# Patient Record
Sex: Female | Born: 1945 | Race: White | Hispanic: No | Marital: Married | Smoking: Never smoker
Health system: Southern US, Community
[De-identification: ages and names within clinical notes are randomized; demographics above are authoritative.]

## PROBLEM LIST (undated history)

## (undated) DIAGNOSIS — E785 Hyperlipidemia, unspecified: Secondary | ICD-10-CM

## (undated) DIAGNOSIS — I480 Paroxysmal atrial fibrillation: Secondary | ICD-10-CM

## (undated) DIAGNOSIS — I10 Essential (primary) hypertension: Secondary | ICD-10-CM

## (undated) DIAGNOSIS — R7982 Elevated C-reactive protein (CRP): Secondary | ICD-10-CM

## (undated) HISTORY — DX: Elevated C-reactive protein (CRP): R79.82

## (undated) HISTORY — DX: Hyperlipidemia, unspecified: E78.5

## (undated) HISTORY — DX: Essential (primary) hypertension: I10

## (undated) HISTORY — DX: Paroxysmal atrial fibrillation: I48.0

---

## 1998-08-16 ENCOUNTER — Other Ambulatory Visit: Admission: RE | Admit: 1998-08-16 | Discharge: 1998-08-16 | Payer: Self-pay | Admitting: Obstetrics and Gynecology

## 1998-09-02 ENCOUNTER — Other Ambulatory Visit: Admission: RE | Admit: 1998-09-02 | Discharge: 1998-09-02 | Payer: Self-pay | Admitting: Obstetrics and Gynecology

## 1999-08-08 ENCOUNTER — Other Ambulatory Visit: Admission: RE | Admit: 1999-08-08 | Discharge: 1999-08-08 | Payer: Self-pay | Admitting: Obstetrics and Gynecology

## 2000-09-10 ENCOUNTER — Other Ambulatory Visit: Admission: RE | Admit: 2000-09-10 | Discharge: 2000-09-10 | Payer: Self-pay | Admitting: Obstetrics and Gynecology

## 2001-09-16 ENCOUNTER — Other Ambulatory Visit: Admission: RE | Admit: 2001-09-16 | Discharge: 2001-09-16 | Payer: Self-pay | Admitting: Obstetrics and Gynecology

## 2002-10-19 ENCOUNTER — Other Ambulatory Visit: Admission: RE | Admit: 2002-10-19 | Discharge: 2002-10-19 | Payer: Self-pay | Admitting: Obstetrics and Gynecology

## 2003-11-03 ENCOUNTER — Other Ambulatory Visit: Admission: RE | Admit: 2003-11-03 | Discharge: 2003-11-03 | Payer: Self-pay | Admitting: Obstetrics and Gynecology

## 2010-08-17 ENCOUNTER — Ambulatory Visit: Payer: Self-pay | Admitting: Cardiology

## 2011-01-11 ENCOUNTER — Ambulatory Visit: Payer: Self-pay | Admitting: Cardiology

## 2011-06-08 ENCOUNTER — Telehealth: Payer: Self-pay | Admitting: Cardiology

## 2011-06-08 NOTE — Telephone Encounter (Signed)
RN scheduled a recall for pt for 01/13 with Dr. [place].  Pt notified.

## 2011-06-08 NOTE — Telephone Encounter (Signed)
Patient called returning your phone call from a few days ago. When offered to be placed in your voice mail she ok, pleasantly gave me her phone number, thanked me and, despite me repeating that I would like to place her in your voicemail, hung up. Please call back

## 2011-07-19 ENCOUNTER — Other Ambulatory Visit: Payer: Self-pay | Admitting: *Deleted

## 2011-07-19 ENCOUNTER — Other Ambulatory Visit: Payer: Self-pay | Admitting: Cardiology

## 2011-07-19 MED ORDER — ATORVASTATIN CALCIUM 10 MG PO TABS: 10.0000 mg | ORAL_TABLET | Freq: Every day | ORAL | Status: DC

## 2012-01-03 ENCOUNTER — Encounter: Payer: Self-pay | Admitting: *Deleted

## 2012-01-03 DIAGNOSIS — I480 Paroxysmal atrial fibrillation: Secondary | ICD-10-CM | POA: Insufficient documentation

## 2012-01-04 ENCOUNTER — Other Ambulatory Visit: Payer: Self-pay

## 2012-01-04 ENCOUNTER — Other Ambulatory Visit (INDEPENDENT_AMBULATORY_CARE_PROVIDER_SITE_OTHER): Payer: Medicare Other | Admitting: *Deleted

## 2012-01-04 DIAGNOSIS — I4891 Unspecified atrial fibrillation: Secondary | ICD-10-CM

## 2012-01-04 DIAGNOSIS — E785 Hyperlipidemia, unspecified: Secondary | ICD-10-CM

## 2012-01-04 LAB — BASIC METABOLIC PANEL
BUN: 14 mg/dL (ref 6–23)
CO2: 27 mEq/L (ref 19–32)
Calcium: 9.1 mg/dL (ref 8.4–10.5)
GFR: 105.83 mL/min (ref >60.00)
Glucose, Bld: 95 mg/dL (ref 70–99)

## 2012-01-04 LAB — HEPATIC FUNCTION PANEL
ALT: 20 U/L (ref 0–35)
AST: 22 U/L (ref 0–37)
Alkaline Phosphatase: 76 U/L (ref 39–117)
Bilirubin, Direct: 0.1 mg/dL (ref 0.0–0.3)
Total Bilirubin: 0.5 mg/dL (ref 0.3–1.2)

## 2012-01-04 LAB — LIPID PANEL
LDL Cholesterol: 76 mg/dL (ref 0–99)
Total CHOL/HDL Ratio: 2

## 2012-01-08 ENCOUNTER — Encounter: Payer: Self-pay | Admitting: Cardiology

## 2012-01-08 ENCOUNTER — Ambulatory Visit (INDEPENDENT_AMBULATORY_CARE_PROVIDER_SITE_OTHER): Payer: Medicare Other | Admitting: Cardiology

## 2012-01-08 VITALS — BP 132/76 | HR 64 | Ht 66.0 in | Wt 146.8 lb

## 2012-01-08 DIAGNOSIS — I10 Essential (primary) hypertension: Secondary | ICD-10-CM | POA: Insufficient documentation

## 2012-01-08 DIAGNOSIS — I480 Paroxysmal atrial fibrillation: Secondary | ICD-10-CM

## 2012-01-08 DIAGNOSIS — E785 Hyperlipidemia, unspecified: Secondary | ICD-10-CM

## 2012-01-08 DIAGNOSIS — I4891 Unspecified atrial fibrillation: Secondary | ICD-10-CM

## 2012-01-08 NOTE — Assessment & Plan Note (Signed)
Symptoms are well controlled on Toprol. I recommend continuing on her same dose. She is to avoid cardiac stimulants. I will followup again in one year.

## 2012-01-08 NOTE — Patient Instructions (Signed)
Continue your current medication except reduce ASA to 81 mg daily.  I will see you again in 1 year with fasting lab.

## 2012-01-08 NOTE — Assessment & Plan Note (Signed)
Blood pressure is under excellent control. We'll continue with her current therapy.

## 2012-01-08 NOTE — Progress Notes (Signed)
Linda Snow Date of Birth: 1945-12-31 Medical Record #[id number]  History of Present Illness: Linda Snow is seen for yearly followup. She is a former patient of Dr. Deborah Chalk. She has a history of paroxysmal atrial fibrillation, hypertension, and hypercholesterolemia. She also has a family history of early coronary disease. She has had an elevated CRP level in the past but her level  one year ago was normal at 0.3. She states she has had a few episodes of irregular heartbeat. This only lasts a few minutes and usually occurs at night. She's had no dizziness or syncope. She denies any chest pain or shortness of breath. She did have an echocardiogram in June of 2011 which showed mild mitral insufficiency. It was otherwise normal. A nuclear stress test in July of 2011 was normal. Ejection fraction was 70%.  Current Outpatient Prescriptions on File Prior to Visit  Medication Sig Dispense Refill  . aspirin 325 MG tablet Take 325 mg by mouth daily.        [name] atorvastatin (LIPITOR) 10 MG tablet Take 1 tablet (10 mg total) by mouth daily.  30 tablet  12  . benazepril (LOTENSIN) 5 MG tablet Take 5 mg by mouth daily.        . Cholecalciferol (VITAMIN D) 2000 UNITS CAPS Take by mouth daily.        . fish oil-omega-3 fatty acids 1000 MG capsule Take 1 g by mouth daily. Taking 1200 daily       . metoprolol (TOPROL-XL) 50 MG 24 hr tablet Take 50 mg by mouth 2 (two) times daily.        . Multiple Vitamin (MULTIVITAMIN) capsule Take 1 capsule by mouth daily.        . vitamin C (ASCORBIC ACID) 500 MG tablet Take 500 mg by mouth daily.          No Known Allergies  Past Medical History  Diagnosis Date  . Hyperlipidemia   . Hypertension   . PAF (paroxysmal atrial fibrillation)     hx of,echo 06/09/2010 normal LV function and normal atrial size  . Elevated C-reactive protein (CRP)     No past surgical history on file.  History  Smoking status  . Never Smoker   Smokeless tobacco  . Not on file     History  Alcohol Use No    No family history on file.  Review of Systems:  All other systems were reviewed and are negative.  Physical Exam: BP 132/76  Pulse 64  Ht 5\' 6"  (1.676 m)  Wt 146 lb 12.8 oz (66.588 kg)  BMI 23.69 kg/m2 The patient is alert and oriented x 3.  The mood and affect are normal.  The skin is warm and dry.  Color is normal.  The HEENT exam reveals that the sclera are nonicteric.  The mucous membranes are moist.  The carotids are 2+ without bruits.  There is no thyromegaly.  There is no JVD.  The lungs are clear.  The chest wall is non tender.  The heart exam reveals a regular rate with a normal S1 and S2.  There are no murmurs, gallops, or rubs.  The PMI is not displaced.   Abdominal exam reveals good bowel sounds.  There is no guarding or rebound.  There is no hepatosplenomegaly or tenderness.  There are no masses.  Exam of the legs reveal no clubbing, cyanosis, or edema.  The legs are without rashes.  The distal pulses are intact.  Cranial  nerves II - XII are intact.  Motor and sensory functions are intact.  The gait is normal.  LABORATORY DATA: Lab Results  Component Value Date   CHOL 150 01/04/2012   HDL 63.50 01/04/2012   LDLCALC 76 01/04/2012   TRIG 54.0 01/04/2012   CHOLHDL 2 01/04/2012   ECG today demonstrates normal sinus rhythm with a rate of 59 beats per minute. There is nonspecific ST abnormality.  Assessment / Plan:

## 2012-01-08 NOTE — Assessment & Plan Note (Signed)
Recent lipid panel was in excellent control on Lipitor. Her last CRP level year ago was normal. We will continue on her current risk factor modification. I will followup again in one year and check fasting lab work at that time.

## 2012-01-12 ENCOUNTER — Other Ambulatory Visit: Payer: Self-pay | Admitting: Cardiology

## 2012-08-13 ENCOUNTER — Other Ambulatory Visit: Payer: Self-pay | Admitting: *Deleted

## 2012-08-13 MED ORDER — ATORVASTATIN CALCIUM 10 MG PO TABS: 10.0000 mg | ORAL_TABLET | Freq: Every day | ORAL | Status: DC

## 2012-08-13 NOTE — Telephone Encounter (Signed)
Fax Received. Refill Completed. Linda Snow (R.M.A)   

## 2012-09-10 ENCOUNTER — Other Ambulatory Visit: Payer: Self-pay | Admitting: Cardiology

## 2012-10-09 ENCOUNTER — Other Ambulatory Visit: Payer: Self-pay | Admitting: Cardiology

## 2013-01-07 ENCOUNTER — Other Ambulatory Visit (INDEPENDENT_AMBULATORY_CARE_PROVIDER_SITE_OTHER): Payer: Medicare Other

## 2013-01-07 DIAGNOSIS — E785 Hyperlipidemia, unspecified: Secondary | ICD-10-CM

## 2013-01-07 DIAGNOSIS — I480 Paroxysmal atrial fibrillation: Secondary | ICD-10-CM

## 2013-01-07 DIAGNOSIS — I4891 Unspecified atrial fibrillation: Secondary | ICD-10-CM

## 2013-01-07 DIAGNOSIS — I10 Essential (primary) hypertension: Secondary | ICD-10-CM

## 2013-01-07 LAB — HEPATIC FUNCTION PANEL
ALT: 23 U/L (ref 0–35)
AST: 22 U/L (ref 0–37)
Albumin: 3.8 g/dL (ref 3.5–5.2)
Alkaline Phosphatase: 62 U/L (ref 39–117)
Total Protein: 7.3 g/dL (ref 6.0–8.3)

## 2013-01-07 LAB — BASIC METABOLIC PANEL
BUN: 13 mg/dL (ref 6–23)
CO2: 28 mEq/L (ref 19–32)
Glucose, Bld: 95 mg/dL (ref 70–99)
Potassium: 4.1 mEq/L (ref 3.5–5.1)
Sodium: 138 mEq/L (ref 135–145)

## 2013-01-13 ENCOUNTER — Encounter: Payer: Self-pay | Admitting: Cardiology

## 2013-01-13 ENCOUNTER — Ambulatory Visit (INDEPENDENT_AMBULATORY_CARE_PROVIDER_SITE_OTHER): Payer: Medicare Other | Admitting: Cardiology

## 2013-01-13 VITALS — BP 160/82 | HR 53 | Ht 66.5 in | Wt 145.2 lb

## 2013-01-13 DIAGNOSIS — I4891 Unspecified atrial fibrillation: Secondary | ICD-10-CM

## 2013-01-13 DIAGNOSIS — E785 Hyperlipidemia, unspecified: Secondary | ICD-10-CM

## 2013-01-13 DIAGNOSIS — I10 Essential (primary) hypertension: Secondary | ICD-10-CM

## 2013-01-13 DIAGNOSIS — I480 Paroxysmal atrial fibrillation: Secondary | ICD-10-CM

## 2013-01-13 NOTE — Progress Notes (Signed)
Linda Snow Date of Birth: 11-Nov-1945 Medical Record #[id number]  History of Present Illness: Linda Snow is seen for yearly followup. She has a history of paroxysmal atrial fibrillation, hypertension, and hypercholesterolemia. She also has a family history of early coronary disease. She has had an elevated CRP level in the past. On follow up today she is feeling well. No chest pain or SOB. She is concerned about hair loss and attributes this to her metoprolol. Her BP at home has ranged from 104-132 systolic and 70-84 diastolic. No palpitations.  Current Outpatient Prescriptions on File Prior to Visit  Medication Sig Dispense Refill  . aspirin 325 MG tablet Take 325 mg by mouth daily.        [name] atorvastatin (LIPITOR) 10 MG tablet Take 1 tablet (10 mg total) by mouth daily.  30 tablet  5  . benazepril (LOTENSIN) 5 MG tablet TAKE 1 TABLET EVERY DAY  90 tablet  4  . Cholecalciferol (VITAMIN D) 2000 UNITS CAPS Take by mouth daily.        . fish oil-omega-3 fatty acids 1000 MG capsule Take 1 g by mouth daily. Taking 1200 daily       . metoprolol succinate (TOPROL-XL) 50 MG 24 hr tablet TAKE 1 TABLET BY MOUTH TWICE A DAY  60 tablet  7  . Multiple Vitamin (MULTIVITAMIN) capsule Take 1 capsule by mouth daily.        . vitamin C (ASCORBIC ACID) 500 MG tablet Take 500 mg by mouth daily.          No Known Allergies  Past Medical History  Diagnosis Date  . Hyperlipidemia   . Hypertension   . PAF (paroxysmal atrial fibrillation)     hx of,echo 06/09/2010 normal LV function and normal atrial size  . Elevated C-reactive protein (CRP)     History reviewed. No pertinent past surgical history.  History  Smoking status  . Never Smoker   Smokeless tobacco  . Not on file    History  Alcohol Use No    History reviewed. No pertinent family history.  Review of Systems: As noted in HPI. All other systems were reviewed and are negative.  Physical Exam: BP 160/82  Pulse 53  Ht 5' 6.5"  (1.689 m)  Wt 145 lb 3.2 oz (65.862 kg)  BMI 23.08 kg/m2 The patient is alert and oriented x 3.  The mood and affect are normal.  The skin is warm and dry.   The HEENT exam is normal. The carotids are 2+ without bruits.  There is no thyromegaly.  There is no JVD.  The lungs are clear. The heart exam reveals a regular rate with a normal S1 and S2.  There are no murmurs, gallops, or rubs.  The PMI is not displaced.   Abdominal exam reveals good bowel sounds.   There is no hepatosplenomegaly or tenderness.  There are no masses.  Exam of the legs reveal no clubbing, cyanosis, or edema. The distal pulses are intact.  Cranial nerves II - XII are intact.  Motor and sensory functions are intact.  The gait is normal.  LABORATORY DATA: Lab Results  Component Value Date   CHOL 156 01/07/2013   HDL 62.80 01/07/2013   LDLCALC 83 01/07/2013   TRIG 50.0 01/07/2013   CHOLHDL 2 01/07/2013   ECG today demonstrates sinus bradycardia with a rate of 53 beats per minute. There is nonspecific ST abnormality.  Assessment / Plan: 1. HTN- well controlled with metoprolol  and benazepril. Will taper off metoprolol given her concern about hair loss. If BP increases we can increase benazepril. Follow up in 6 months.  2. Paroxysmal atrial fibrillation. No recurrence. Avoid stimulants. If she notes increased arrhythmia off metoprolol would consider diltiazem instead.   3. Hyperlipidemia. Reviewed last lipid panel. Suggested reducing lipitor to 5 mg daily. Repeat fasting labs including chemistries, lipids, and CRP in 2-3 months.

## 2013-01-13 NOTE — Patient Instructions (Addendum)
Reduce toprol to 50 mg daily for 3 weeks. If BP remains controlled and no arrhythmia reduce to 25 mg daily then stop.  Reduce lipitor to 5 mg daily  I will see you in 6 months with fasting lab

## 2013-07-07 ENCOUNTER — Encounter: Payer: Self-pay | Admitting: Cardiology

## 2013-07-16 ENCOUNTER — Other Ambulatory Visit: Payer: Self-pay | Admitting: Cardiology

## 2013-07-27 ENCOUNTER — Encounter: Payer: Self-pay | Admitting: Cardiology

## 2013-07-27 ENCOUNTER — Ambulatory Visit (INDEPENDENT_AMBULATORY_CARE_PROVIDER_SITE_OTHER): Payer: Medicare Other | Admitting: Cardiology

## 2013-07-27 VITALS — BP 130/70 | HR 66 | Ht 66.5 in | Wt 143.0 lb

## 2013-07-27 DIAGNOSIS — I10 Essential (primary) hypertension: Secondary | ICD-10-CM

## 2013-07-27 DIAGNOSIS — I4891 Unspecified atrial fibrillation: Secondary | ICD-10-CM

## 2013-07-27 DIAGNOSIS — I480 Paroxysmal atrial fibrillation: Secondary | ICD-10-CM

## 2013-07-27 DIAGNOSIS — E785 Hyperlipidemia, unspecified: Secondary | ICD-10-CM

## 2013-07-27 MED ORDER — RIVAROXABAN 20 MG PO TABS: 20.0000 mg | ORAL_TABLET | Freq: Every day | ORAL | Status: DC

## 2013-07-27 MED ORDER — ATORVASTATIN CALCIUM 10 MG PO TABS: 5.0000 mg | ORAL_TABLET | Freq: Every day | ORAL | Status: DC

## 2013-07-27 MED ORDER — BENAZEPRIL HCL 10 MG PO TABS: 10.0000 mg | ORAL_TABLET | Freq: Every day | ORAL | Status: DC

## 2013-07-27 MED ORDER — METOPROLOL SUCCINATE ER 25 MG PO TB24: 25.0000 mg | ORAL_TABLET | Freq: Every day | ORAL | Status: DC

## 2013-07-27 NOTE — Progress Notes (Signed)
Linda Snow Date of Birth: 1945-01-29 Medical Record #[id number]  History of Present Illness: Linda Snow is seen for yearly followup. She has a history of paroxysmal atrial fibrillation, hypertension, and hypercholesterolemia. She also has a family history of early coronary disease. She has had an elevated CRP level in the past. And followup today she reports she is doing well. We previously had reduced her metoprolol dose because of hair loss. He states this did help significantly when we reduced her dose to 25 mg. Her blood pressure did increase and she increased her benazepril to 10 mg daily. Since then her blood pressure has been well-controlled. She does report episodes of palpitations. She can recall 4 episodes of irregular rhythm some lasting over 12 hours. She denies any dizziness or syncope. She has no shortness of breath or chest pain.  Current Outpatient Prescriptions on File Prior to Visit  Medication Sig Dispense Refill  . Cholecalciferol (VITAMIN D) 2000 UNITS CAPS Take by mouth daily.        . fish oil-omega-3 fatty acids 1000 MG capsule Take 1 g by mouth daily. Taking 1200 daily       . Multiple Vitamin (MULTIVITAMIN) capsule Take 1 capsule by mouth daily.        . vitamin C (ASCORBIC ACID) 500 MG tablet Take 500 mg by mouth daily.         No current facility-administered medications on file prior to visit.    No Known Allergies  Past Medical History  Diagnosis Date  . Hyperlipidemia   . Hypertension   . PAF (paroxysmal atrial fibrillation)     hx of,echo 06/09/2010 normal LV function and normal atrial size  . Elevated C-reactive protein (CRP)     History reviewed. No pertinent past surgical history.  History  Smoking status  . Never Smoker   Smokeless tobacco  . Not on file    History  Alcohol Use No    History reviewed. No pertinent family history.  Review of Systems: As noted in HPI. All other systems were reviewed and are negative.  Physical  Exam: BP 130/70  Pulse 66  Ht 5' 6.5" (1.689 m)  Wt 143 lb (64.864 kg)  BMI 22.74 kg/m2  SpO2 97% The patient is alert and oriented x 3.   The skin is warm and dry.   The HEENT exam is normal. The carotids are 2+ without bruits.  There is no thyromegaly.  There is no JVD.  The lungs are clear. The heart exam reveals a regular rate with a normal S1 and S2.  There are no murmurs, gallops, or rubs.  The PMI is not displaced.   Abdominal exam reveals good bowel sounds.   There is no hepatosplenomegaly or tenderness.  There are no masses.  Exam of the legs reveal no clubbing, cyanosis, or edema. The distal pulses are intact.  Cranial nerves II - XII are intact.  Motor and sensory functions are intact.  The gait is normal.  LABORATORY DATA: Most recent laboratory data from 07/08/2013 shows a C-reactive protein of 19, total cholesterol 163, triglycerides 66, HDL 64, LDL 81. Complete chemistry panel was normal.  Assessment / Plan: 1. HTN- well controlled with metoprolol and benazepril. Continue current therapy.  2. Paroxysmal atrial fibrillation. She has had symptomatic recurrence. Her CHAD-Vasc score is 3 based on her age, hypertension, and female sex. I recommended anticoagulation. We discussed options including Coumadin and the novel agents. We have elected to start her on  Xarelto 20 mg daily. We will stop aspirin.  3. Hyperlipidemia. Reviewed last lipid panel. Continue lipitor to 5 mg daily.

## 2013-07-27 NOTE — Patient Instructions (Signed)
Stop ASA   Start Xarelto 20 mg daily with evening meal.  Avoid NSAIDs.

## 2014-01-04 ENCOUNTER — Encounter: Payer: Self-pay | Admitting: Cardiology

## 2014-01-04 ENCOUNTER — Ambulatory Visit (INDEPENDENT_AMBULATORY_CARE_PROVIDER_SITE_OTHER): Payer: Medicare Other | Admitting: Cardiology

## 2014-01-04 ENCOUNTER — Encounter (INDEPENDENT_AMBULATORY_CARE_PROVIDER_SITE_OTHER): Payer: Self-pay

## 2014-01-04 VITALS — BP 140/80 | HR 55 | Ht 66.0 in | Wt 138.0 lb

## 2014-01-04 DIAGNOSIS — I4891 Unspecified atrial fibrillation: Secondary | ICD-10-CM

## 2014-01-04 DIAGNOSIS — E785 Hyperlipidemia, unspecified: Secondary | ICD-10-CM

## 2014-01-04 DIAGNOSIS — I480 Paroxysmal atrial fibrillation: Secondary | ICD-10-CM

## 2014-01-04 DIAGNOSIS — I10 Essential (primary) hypertension: Secondary | ICD-10-CM

## 2014-01-04 NOTE — Patient Instructions (Signed)
Stop taking Toprol  Monitor your blood pressure and let me know if it is staying high.

## 2014-01-04 NOTE — Progress Notes (Signed)
Linda Snow Date of Birth: 09-Oct-1945 Medical Record #[id number]  History of Present Illness: Linda Snow is seen for yearly followup. She has a history of paroxysmal atrial fibrillation, hypertension, and hypercholesterolemia. She also has a family history of early coronary disease. She has had an elevated CRP level in the past. And followup today she reports she is doing well. We previously had reduced her metoprolol dose because of hair loss. He states this did help significantly when we reduced her dose to 25 mg. Her blood pressure did increase and she increased her benazepril to 10 mg daily. Since then her blood pressure has been well-controlled. She denies any recurrent palpitations. She would like to come off metoprolol completely.  Current Outpatient Prescriptions on File Prior to Visit  Medication Sig Dispense Refill  . atorvastatin (LIPITOR) 10 MG tablet Take 0.5 tablets (5 mg total) by mouth daily.  30 tablet  5  . benazepril (LOTENSIN) 10 MG tablet Take 1 tablet (10 mg total) by mouth daily.  90 tablet  3  . Cholecalciferol (VITAMIN D) 2000 UNITS CAPS Take by mouth daily.        . fish oil-omega-3 fatty acids 1000 MG capsule Take 1 g by mouth daily. Taking 1200 daily       . metoprolol succinate (TOPROL-XL) 25 MG 24 hr tablet Take 1 tablet (25 mg total) by mouth daily. Take with or immediately following a meal.  60 tablet  7  . Multiple Vitamin (MULTIVITAMIN) capsule Take 1 capsule by mouth daily.        . Rivaroxaban (XARELTO) 20 MG TABS Take 1 tablet (20 mg total) by mouth daily with supper.  90 tablet  3  . vitamin C (ASCORBIC ACID) 500 MG tablet Take 500 mg by mouth daily.         No current facility-administered medications on file prior to visit.    No Known Allergies  Past Medical History  Diagnosis Date  . Hyperlipidemia   . Hypertension   . PAF (paroxysmal atrial fibrillation)     hx of,echo 06/09/2010 normal LV function and normal atrial size  . Elevated  C-reactive protein (CRP)     History reviewed. No pertinent past surgical history.  History  Smoking status  . Never Smoker   Smokeless tobacco  . Not on file    History  Alcohol Use No    History reviewed. No pertinent family history.  Review of Systems: As noted in HPI. All other systems were reviewed and are negative.  Physical Exam: BP 140/80  Pulse 55  Ht 5\' 6"  (1.676 m)  Wt 138 lb (62.596 kg)  BMI 22.28 kg/m2 The patient is alert and oriented x 3.   The skin is warm and dry.   The HEENT exam is normal. The carotids are 2+ without bruits.  There is no thyromegaly.  There is no JVD.  The lungs are clear. The heart exam reveals a regular rate with a normal S1 and S2.  There are no murmurs, gallops, or rubs.  The PMI is not displaced.   Abdominal exam reveals good bowel sounds.   There is no hepatosplenomegaly or tenderness.  There are no masses.  Exam of the legs reveal no clubbing, cyanosis, or edema. The distal pulses are intact.  Cranial nerves II - XII are intact.  Motor and sensory functions are intact.  The gait is normal.  LABORATORY DATA: Ecg today demonstrates sinus bradycardia with rate 55 bpm. Mild nonspecific  ST abnormality.  Assessment / Plan: 1. HTN- well controlled with metoprolol and benazepril. We will stop metoprolol now. If BP increases will try increasing benazepril dose. I will follow up in 6 months.  2. Paroxysmal atrial fibrillation.  Her CHAD-Vasc score is 3 based on her age, hypertension, and female sex. Continue Xarelto.  3. Hyperlipidemia. Reviewed last lipid panel. Continue lipitor to 5 mg daily.

## 2014-07-05 ENCOUNTER — Encounter: Payer: Self-pay | Admitting: Cardiology

## 2014-07-05 ENCOUNTER — Ambulatory Visit (INDEPENDENT_AMBULATORY_CARE_PROVIDER_SITE_OTHER): Payer: Medicare Other | Admitting: Cardiology

## 2014-07-05 VITALS — BP 149/95 | HR 79 | Ht 66.5 in | Wt 136.6 lb

## 2014-07-05 DIAGNOSIS — I10 Essential (primary) hypertension: Secondary | ICD-10-CM

## 2014-07-05 DIAGNOSIS — E785 Hyperlipidemia, unspecified: Secondary | ICD-10-CM

## 2014-07-05 DIAGNOSIS — I4891 Unspecified atrial fibrillation: Secondary | ICD-10-CM

## 2014-07-05 DIAGNOSIS — I480 Paroxysmal atrial fibrillation: Secondary | ICD-10-CM

## 2014-07-05 MED ORDER — BENAZEPRIL HCL 10 MG PO TABS: 10.0000 mg | ORAL_TABLET | Freq: Every day | ORAL | Status: DC

## 2014-07-05 MED ORDER — RIVAROXABAN 20 MG PO TABS: 20.0000 mg | ORAL_TABLET | Freq: Every day | ORAL | Status: DC

## 2014-07-05 NOTE — Patient Instructions (Signed)
Continue your current therapy  I will see you in 6 months.   

## 2014-07-05 NOTE — Progress Notes (Signed)
   Linda Snow Date of Birth: 07/04/1945 Medical Record #[id number]  History of Present Illness: Linda Snow is seen for  followup. She has a history of paroxysmal atrial fibrillation, hypertension, and hypercholesterolemia. She also has a family history of early coronary disease. She has had an elevated CRP level in the past. And followup today she reports she is doing well. She is off of her metoprolol completely now and her hair loss has reversed. BP at home has been well controlled with readings of 110-134/ 70-90. Denies any palpitations. Stays active.  Current Outpatient Prescriptions on File Prior to Visit  Medication Sig Dispense Refill  . atorvastatin (LIPITOR) 10 MG tablet Take 0.5 tablets (5 mg total) by mouth daily.  30 tablet  5  . Cholecalciferol (VITAMIN D) 2000 UNITS CAPS Take by mouth daily.        . fish oil-omega-3 fatty acids 1000 MG capsule Take 1 g by mouth daily. Taking 1200 daily       . Multiple Vitamin (MULTIVITAMIN) capsule Take 1 capsule by mouth daily.        . vitamin C (ASCORBIC ACID) 500 MG tablet Take 500 mg by mouth daily.         No current facility-administered medications on file prior to visit.    No Known Allergies  Past Medical History  Diagnosis Date  . Hyperlipidemia   . Hypertension   . PAF (paroxysmal atrial fibrillation)     hx of,echo 06/09/2010 normal LV function and normal atrial size  . Elevated C-reactive protein (CRP)     History reviewed. No pertinent past surgical history.  History  Smoking status  . Never Smoker   Smokeless tobacco  . Not on file    History  Alcohol Use No    History reviewed. No pertinent family history.  Review of Systems: As noted in HPI. All other systems were reviewed and are negative.  Physical Exam: BP 149/95  Pulse 79  Ht 5' 6.5" (1.689 m)  Wt 136 lb 9.6 oz (61.961 kg)  BMI 21.72 kg/m2 The patient is alert and oriented x 3.   The skin is warm and dry.   The HEENT exam is normal.  The carotids are 2+ without bruits.  There is no thyromegaly.  There is no JVD.  The lungs are clear. The heart exam reveals a regular rate with a normal S1 and S2.  There are no murmurs, gallops, or rubs.  The PMI is not displaced.   Abdominal exam reveals good bowel sounds.   There is no hepatosplenomegaly or tenderness.  There are no masses.  Exam of the legs reveal no clubbing, cyanosis, or edema. The distal pulses are intact.  Cranial nerves II - XII are intact.  Motor and sensory functions are intact.  The gait is normal.  LABORATORY DATA:   Assessment / Plan: 1. HTN- well controlled with  Benazepril only. We stay off metoprolol.   2. Paroxysmal atrial fibrillation.  Her CHAD-Vasc score is 3 based on her age, hypertension, and female sex. Continue Xarelto.  3. Hyperlipidemia. Reviewed last lipid panel. Continue lipitor to 5 mg daily.

## 2015-02-07 ENCOUNTER — Ambulatory Visit (INDEPENDENT_AMBULATORY_CARE_PROVIDER_SITE_OTHER): Payer: Medicare PPO | Admitting: Cardiology

## 2015-02-07 ENCOUNTER — Encounter: Payer: Self-pay | Admitting: Cardiology

## 2015-02-07 VITALS — BP 142/90 | HR 106 | Ht 66.5 in | Wt 135.8 lb

## 2015-02-07 DIAGNOSIS — I10 Essential (primary) hypertension: Secondary | ICD-10-CM

## 2015-02-07 DIAGNOSIS — E785 Hyperlipidemia, unspecified: Secondary | ICD-10-CM

## 2015-02-07 DIAGNOSIS — I480 Paroxysmal atrial fibrillation: Secondary | ICD-10-CM

## 2015-02-07 NOTE — Patient Instructions (Signed)
Continue your current therapy  I will see you in 6 months.   

## 2015-02-07 NOTE — Progress Notes (Signed)
Linda Snow Date of Birth: 1945/12/22 Medical Record #[id number]  History of Present Illness: Linda Snow is seen for  followup Afib. She has a history of paroxysmal atrial fibrillation, hypertension, and hypercholesterolemia. She also has a family history of early coronary disease. She has had an elevated CRP level in the past. On  followup today she reports she is doing well. Metoprolol caused hair loss in the past. . BP at home has been well controlled. She stays active. She is really unaware of her heart beat. 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   Current Outpatient Prescriptions on File Prior to Visit  Medication Sig Dispense Refill  . atorvastatin (LIPITOR) 10 MG tablet Take 0.5 tablets (5 mg total) by mouth daily. 30 tablet 5  . benazepril (LOTENSIN) 10 MG tablet Take 1 tablet (10 mg total) by mouth daily. 90 tablet 3  . Cholecalciferol (VITAMIN D) 2000 UNITS CAPS Take by mouth daily.      . fish oil-omega-3 fatty acids 1000 MG capsule Take 1 g by mouth daily. Taking 1200 daily     . Multiple Vitamin (MULTIVITAMIN) capsule Take 1 capsule by mouth daily.      . rivaroxaban (XARELTO) 20 MG TABS tablet Take 1 tablet (20 mg total) by mouth daily with supper. 90 tablet 3  . vitamin C (ASCORBIC ACID) 500 MG tablet Take 500 mg by mouth daily.       No current facility-administered medications on file prior to visit.    Allergies  Allergen Reactions  . Metoprolol Other (See Comments)    hairloss    Past Medical History  Diagnosis Date  . Hyperlipidemia   . Hypertension   . PAF (paroxysmal atrial fibrillation)     hx of,echo 06/09/2010 normal LV function and normal atrial size  . Elevated C-reactive protein (CRP)     History reviewed. No pertinent past surgical history.  History  Smoking status  . Never Smoker   Smokeless tobacco  . Not on file    History  Alcohol Use No    History reviewed. No pertinent family history.  Review of  Systems: As noted in HPI. All other systems were reviewed and are negative.  Physical Exam: BP 142/90 mmHg  Pulse 106  Ht 5' 6.5" (1.689 m)  Wt 135 lb 12.8 oz (61.598 kg)  BMI 21.59 kg/m2 The patient is alert and oriented x 3.   The skin is warm and dry.   The HEENT exam is normal. The carotids are 2+ without bruits.  There is no thyromegaly.  There is no JVD.  The lungs are clear. The heart exam reveals a regular rate with a normal S1 and S2.  There are no murmurs, gallops, or rubs.  The PMI is not displaced.   Abdominal exam reveals good bowel sounds.   There is no hepatosplenomegaly or tenderness.  There are no masses.  Exam of the legs reveal no clubbing, cyanosis, or edema. The distal pulses are intact.  Cranial nerves II - XII are intact.  Motor and sensory functions are intact.  The gait is normal.  LABORATORY DATA: Extensive lab work reviewed and scanned into EMR.  Ecg today shows atrial fibrillation with rate 106 bpm. Nonspecific ST changes.   Assessment / Plan: 1. HTN- well controlled with  Benazepril only.  2. Paroxysmal atrial fibrillation.  Her CHAD-Vasc score is 3 based on her age, hypertension, and female sex. Continue Xarelto. Will not pursue further rate control  since rate fairly good today and she has history of intolerance to beta blocker. She is asymptomatic.   3. Hyperlipidemia. Reviewed last lipid panel. Continue lipitor to 5 mg daily.

## 2015-07-15 ENCOUNTER — Other Ambulatory Visit: Payer: Self-pay | Admitting: *Deleted

## 2015-07-15 DIAGNOSIS — I480 Paroxysmal atrial fibrillation: Secondary | ICD-10-CM

## 2015-07-15 MED ORDER — RIVAROXABAN 20 MG PO TABS: 20.0000 mg | ORAL_TABLET | Freq: Every day | ORAL | Status: DC

## 2015-08-17 ENCOUNTER — Encounter: Payer: Self-pay | Admitting: *Deleted

## 2015-08-25 ENCOUNTER — Encounter: Payer: Self-pay | Admitting: Cardiology

## 2015-08-25 ENCOUNTER — Ambulatory Visit (INDEPENDENT_AMBULATORY_CARE_PROVIDER_SITE_OTHER): Payer: Medicare PPO | Admitting: Cardiology

## 2015-08-25 VITALS — BP 131/79 | HR 104 | Ht 66.0 in | Wt 136.6 lb

## 2015-08-25 DIAGNOSIS — E785 Hyperlipidemia, unspecified: Secondary | ICD-10-CM | POA: Diagnosis not present

## 2015-08-25 DIAGNOSIS — I480 Paroxysmal atrial fibrillation: Secondary | ICD-10-CM

## 2015-08-25 DIAGNOSIS — I10 Essential (primary) hypertension: Secondary | ICD-10-CM

## 2015-08-25 NOTE — Patient Instructions (Signed)
Continue your current therapy  I will see you in 6 months.   

## 2015-08-25 NOTE — Progress Notes (Signed)
Linda Snow Date of Birth: 15-Dec-1945 Medical Record #[id number]  History of Present Illness: Linda Snow is seen for  followup Afib. She has a history of paroxysmal atrial fibrillation, hypertension, and hypercholesterolemia. She also has a family history of early coronary disease. She has had an elevated CRP level in the past.  On  followup today she reports she is doing well. Metoprolol caused hair loss in the past.  BP at home has been well controlled. She stays active. Denies any palpitations or dizziness. She is under a lot of stress with husband who is chronically ill with COPD.  Current Outpatient Prescriptions on File Prior to Visit  Medication Sig Dispense Refill  . benazepril (LOTENSIN) 10 MG tablet Take 1 tablet (10 mg total) by mouth daily. 90 tablet 3  . Cholecalciferol (VITAMIN D) 2000 UNITS CAPS Take by mouth daily.      . fish oil-omega-3 fatty acids 1000 MG capsule Take 1 g by mouth daily. Taking 1200 daily     . Multiple Vitamin (MULTIVITAMIN) capsule Take 1 capsule by mouth daily.      . rivaroxaban (XARELTO) 20 MG TABS tablet Take 1 tablet (20 mg total) by mouth daily with supper. 90 tablet 3  . vitamin C (ASCORBIC ACID) 500 MG tablet Take 500 mg by mouth daily.       No current facility-administered medications on file prior to visit.    Allergies  Allergen Reactions  . Metoprolol Other (See Comments)    hairloss    Past Medical History  Diagnosis Date  . Hyperlipidemia   . Hypertension   . PAF (paroxysmal atrial fibrillation)     hx of,echo 06/09/2010 normal LV function and normal atrial size  . Elevated C-reactive protein (CRP)     History reviewed. No pertinent past surgical history.  History  Smoking status  . Never Smoker   Smokeless tobacco  . Not on file    History  Alcohol Use No    Family History  Problem Relation Age of Onset  . CVA Mother   . Heart attack Mother   . Hypertension Mother   . Heart attack Father   . Heart  attack Brother     Review of Systems: As noted in HPI. All other systems were reviewed and are negative.  Physical Exam: BP 131/79 mmHg  Pulse 104  Ht 5\' 6"  (1.676 m)  Wt 61.961 kg (136 lb 9.6 oz)  BMI 22.06 kg/m2 The patient is alert and oriented x 3.   The skin is warm and dry.   The HEENT exam is normal. The carotids are 2+ without bruits.  There is no thyromegaly.  There is no JVD.  The lungs are clear. The heart exam reveals an irregular rate with a normal S1 and S2.  There are no murmurs, gallops, or rubs.  The PMI is not displaced.   Abdominal exam reveals good bowel sounds.   There is no hepatosplenomegaly or tenderness.  There are no masses.  Exam of the legs reveal no clubbing, cyanosis, or edema. The distal pulses are intact.  Cranial nerves II - XII are intact.  Motor and sensory functions are intact.  The gait is normal.  LABORATORY DATA: Lab work reviewed from 07/13/15: normal CMET, TSH. Cholesterol 152, triglycerides 57, HDL 67, LDL 74.  Assessment / Plan: 1. HTN- well controlled with  Benazepril only.  2. Paroxysmal atrial fibrillation.  Her CHAD-Vasc score is 3 based on her age, hypertension, and  female sex. Continue Xarelto. Will not pursue further rate control since rate fairly good today in Afib and she has history of intolerance to beta blocker. She is asymptomatic.   3. Hyperlipidemia. Reviewed last lipid panel. Continue current therapy

## 2016-03-01 ENCOUNTER — Ambulatory Visit (INDEPENDENT_AMBULATORY_CARE_PROVIDER_SITE_OTHER): Payer: Medicare PPO | Admitting: Cardiology

## 2016-03-01 ENCOUNTER — Encounter: Payer: Self-pay | Admitting: Cardiology

## 2016-03-01 VITALS — BP 148/96 | HR 93 | Ht 66.0 in | Wt 135.0 lb

## 2016-03-01 DIAGNOSIS — I480 Paroxysmal atrial fibrillation: Secondary | ICD-10-CM

## 2016-03-01 DIAGNOSIS — I10 Essential (primary) hypertension: Secondary | ICD-10-CM

## 2016-03-01 NOTE — Progress Notes (Signed)
Linda Snow Date of Birth: [birth year] Medical Record [id number]  History of Present Illness: Linda Snow is seen for  followup Afib. She has a history of paroxysmal atrial fibrillation, hypertension, and hypercholesterolemia. She also has a family history of early coronary disease. She has had an elevated CRP level in the past.  On  followup today she notes that her husband passed in November with COPD. She states she is doing well and that she can tell when her heart is out of rhythm but denies any chest pain, SOB, or dizziness. Metoprolol caused hair loss in the past.  BP at home has been well controlled. She stays active.   Current Outpatient Prescriptions on File Prior to Visit  Medication Sig Dispense Refill  . benazepril (LOTENSIN) 10 MG tablet Take 1 tablet (10 mg total) by mouth daily. 90 tablet 3  . Cholecalciferol (VITAMIN D) 2000 UNITS CAPS Take by mouth daily.      . fish oil-omega-3 fatty acids 1000 MG capsule Take 1 g by mouth daily. Taking 1200 daily     . Multiple Vitamin (MULTIVITAMIN) capsule Take 1 capsule by mouth daily.      . rivaroxaban (XARELTO) 20 MG TABS tablet Take 1 tablet (20 mg total) by mouth daily with supper. 90 tablet 3  . vitamin C (ASCORBIC ACID) 500 MG tablet Take 500 mg by mouth daily.       No current facility-administered medications on file prior to visit.    Allergies  Allergen Reactions  . Metoprolol Other (See Comments)    hairloss    Past Medical History  Diagnosis Date  . Hyperlipidemia   . Hypertension   . PAF (paroxysmal atrial fibrillation) (HCC)     hx of,echo 06/09/2010 normal LV function and normal atrial size  . Elevated C-reactive protein (CRP)     History reviewed. No pertinent past surgical history.  History  Smoking status  . Never Smoker   Smokeless tobacco  . Not on file    History  Alcohol Use No    Family History  Problem Relation Age of Onset  . CVA Mother   . Heart attack Mother   .  Hypertension Mother   . Heart attack Father   . Heart attack Brother     Review of Systems: As noted in HPI. All other systems were reviewed and are negative.  Physical Exam: BP 148/96 mmHg  Pulse 93  Ht 5\' 6"  (1.676 m)  Wt 61.236 kg (135 lb)  BMI 21.80 kg/m2 The patient is alert and oriented x 3.   The skin is warm and dry.   The HEENT exam is normal. The carotids are 2+ without bruits.  There is no thyromegaly.  There is no JVD.  The lungs are clear. The heart exam reveals an irregular rate with a normal S1 and S2.  There are no murmurs, gallops, or rubs.  The PMI is not displaced.   Abdominal exam reveals good bowel sounds.   There is no hepatosplenomegaly or tenderness.  There are no masses.  Exam of the legs reveal no clubbing, cyanosis, or edema. The distal pulses are intact.  Cranial nerves II - XII are intact.  Motor and sensory functions are intact.  The gait is normal.  LABORATORY DATA: Ecg today shows Atrial fibrillation with rate 93. Nonspecific ST abnormality. I have personally reviewed and interpreted this study.   Assessment / Plan: 1. HTN- well controlled with  Benazepril only.  2. Paroxysmal  atrial fibrillation. ? Now premanent. Patient thinks she is in and out but last 3 visits here she has been in Afib.  Her CHAD-Vasc score is 3 based on her age, hypertension, and female sex. Continue Xarelto. Will not pursue further rate control since rate OK on no therapy. She has history of intolerance to beta blocker. She is asymptomatic.   3. Hyperlipidemia.  Continue current therapy

## 2016-03-01 NOTE — Patient Instructions (Signed)
Continue your current therapy  I will see you in 6 months.   

## 2016-03-16 ENCOUNTER — Ambulatory Visit: Payer: Medicare PPO | Admitting: *Deleted

## 2016-03-16 ENCOUNTER — Telehealth: Payer: Self-pay | Admitting: Cardiology

## 2016-03-16 VITALS — BP 130/80

## 2016-03-16 DIAGNOSIS — I10 Essential (primary) hypertension: Secondary | ICD-10-CM

## 2016-03-16 NOTE — Telephone Encounter (Signed)
New Message  Pt requested to have BP checked to day @ Jordan's office. Please call back and discuss.

## 2016-03-16 NOTE — Progress Notes (Signed)
1.) Reason for visit: blood pressure check  2.) Name of MD requesting visit: Martinique  3.) H&P: see telephone note from today  4.) ROS related to problem: no complaints or problems  5.) Assessment and plan per MD: none needed

## 2016-03-16 NOTE — Telephone Encounter (Signed)
Pt noted BP high yesterday at walk-in clinic. Wanted to recheck.  Advised OK - will see today for nurse visit.  She denies acute concerns other than BP elevation yesterday.

## 2016-03-19 NOTE — Telephone Encounter (Signed)
Seen in nurse visit Friday.

## 2016-06-20 ENCOUNTER — Telehealth: Payer: Self-pay | Admitting: Cardiology

## 2016-06-20 NOTE — Telephone Encounter (Signed)
Returned call to patient no answer.LMTC. 

## 2016-06-20 NOTE — Telephone Encounter (Signed)
New message      The pt was calling about a fax sent on Monday from Dr. L[place]le [place] office    1. What dental office are you calling from? Dr. Florian Buff office  2. What is your office phone and fax number? Pt uncertain of the medication   3. What type of procedure is the patient having performed? Tooth exactraction  What date is procedure scheduled? pending 4. What is your question (ex. Antibiotics prior to procedure, holding medication-we need to know how long dentist wants pt to hold med)? Pt on Xarelto needs to know how long to be off medication

## 2016-06-20 NOTE — Telephone Encounter (Signed)
Message sent to Dr.Jordan for advice. 

## 2016-06-20 NOTE — Telephone Encounter (Signed)
May hold Xarelto for 48 hours for dental extraction. Then resume post procedure.  Peter Martinique MD, [facility]

## 2016-06-20 NOTE — Telephone Encounter (Signed)
Returned call to patient.Dr.Jordan advised ok to hold Xarelto 48 hours prior to dental extractions.Then resume after procedure.Note faxed to Dr.Mohorn's office at fax # [phone].

## 2016-07-12 ENCOUNTER — Telehealth: Payer: Self-pay

## 2016-07-12 NOTE — Telephone Encounter (Signed)
Received medical clearance from [facility].Dr.Jordan advised ok to hold Xarelto 2 days before oral surgery.

## 2016-07-15 ENCOUNTER — Other Ambulatory Visit: Payer: Self-pay | Admitting: Cardiology

## 2016-07-17 NOTE — Telephone Encounter (Signed)
New message       *STAT* If patient is at the pharmacy, call can be transferred to refill team.   1. Which medications need to be refilled? (please list name of each medication and dose if known) xarelto 20mg  2. Which pharmacy/location (including street and city if local pharmacy) is medication to be sent to? [email or web address] [address] [address] 3. Do they need a 30 day or 90 day supply? [place]

## 2016-07-17 NOTE — Telephone Encounter (Signed)
Prescription sent to pharmacy.

## 2016-09-17 ENCOUNTER — Other Ambulatory Visit: Payer: Self-pay | Admitting: Gastroenterology

## 2016-09-17 DIAGNOSIS — R112 Nausea with vomiting, unspecified: Secondary | ICD-10-CM

## 2016-09-19 ENCOUNTER — Other Ambulatory Visit: Payer: Self-pay | Admitting: Gastroenterology

## 2016-09-19 ENCOUNTER — Ambulatory Visit
Admission: RE | Admit: 2016-09-19 | Discharge: 2016-09-19 | Disposition: A | Discharge status: Home / Self Care | Payer: Medicare PPO | Source: Ambulatory Visit | Attending: Gastroenterology | Admitting: Gastroenterology

## 2016-09-19 DIAGNOSIS — R112 Nausea with vomiting, unspecified: Secondary | ICD-10-CM

## 2016-10-01 ENCOUNTER — Ambulatory Visit
Admission: RE | Admit: 2016-10-01 | Discharge: 2016-10-01 | Disposition: A | Discharge status: Home / Self Care | Payer: Medicare PPO | Source: Ambulatory Visit | Attending: Gastroenterology | Admitting: Gastroenterology

## 2016-10-01 DIAGNOSIS — R112 Nausea with vomiting, unspecified: Secondary | ICD-10-CM

## 2016-10-02 ENCOUNTER — Encounter: Payer: Self-pay | Admitting: Gastroenterology

## 2016-11-01 ENCOUNTER — Encounter: Payer: Self-pay | Admitting: Cardiology

## 2016-11-14 NOTE — Progress Notes (Signed)
Linda Snow Date of Birth: 12-24-1945 Medical Record [id number]  History of Present Illness: Linda Snow is seen for  followup Afib. She has a history of paroxysmal atrial fibrillation, hypertension, and hypercholesterolemia. She also has a family history of early coronary disease. She has had an elevated CRP level in the past.  On  followup today she notes that her husband passed in November 2016 with COPD. She states she is doing well and that she can tell when her heart is out of rhythm but denies any chest pain, SOB, or dizziness. Metoprolol caused hair loss in the past.  BP at home has been well controlled. She stays active. She notes mild arrhythmia about once a week lasting a few minutes. She did have recent GI evaluation in September with UGI series and abdominal US which were normal. She has intermittent N/V every few months.  Current Outpatient Prescriptions on File Prior to Visit  Medication Sig Dispense Refill  . benazepril (LOTENSIN) 10 MG tablet Take 1 tablet (10 mg total) by mouth daily. 90 tablet 3  . Cholecalciferol (VITAMIN D) 2000 UNITS CAPS Take by mouth daily.      . fish oil-omega-3 fatty acids 1000 MG capsule Take 1 g by mouth daily. Taking 1200 daily     . Multiple Vitamin (MULTIVITAMIN) capsule Take 1 capsule by mouth daily.      . vitamin C (ASCORBIC ACID) 500 MG tablet Take 500 mg by mouth daily.      [name] 20 MG TABS tablet TAKE 1 TABLET (20 MG TOTAL) BY MOUTH DAILY WITH SUPPER. 90 tablet 3   No current facility-administered medications on file prior to visit.     Allergies  Allergen Reactions  . Metoprolol Other (See Comments)    hairloss    Past Medical History:  Diagnosis Date  . Elevated C-reactive protein (CRP)   . Hyperlipidemia   . Hypertension   . PAF (paroxysmal atrial fibrillation) (HCC)    hx of,echo 06/09/2010 normal LV function and normal atrial size    History reviewed. No pertinent surgical history.  History  Smoking  Status  . Never Smoker  Smokeless Tobacco  . Never Used    History  Alcohol Use No    Family History  Problem Relation Age of Onset  . CVA Mother   . Heart attack Mother   . Hypertension Mother   . Heart attack Father   . Heart attack Brother     Review of Systems: As noted in HPI. All other systems were reviewed and are negative.  Physical Exam: BP 135/80, pulse 85, weight 136.8 lbs. The patient is alert and oriented x 3.   The skin is warm and dry.   The HEENT exam is normal. The carotids are 2+ without bruits.  There is no thyromegaly.  There is no JVD.  The lungs are clear. The heart exam reveals an irregular rate with a normal S1 and S2.  There are no murmurs, gallops, or rubs.  The PMI is not displaced.   Abdominal exam reveals good bowel sounds.   There is no hepatosplenomegaly or tenderness.  There are no masses.  Exam of the legs reveal no clubbing, cyanosis, or edema. The distal pulses are intact.  Cranial nerves II - XII are intact.  Motor and sensory functions are intact.  The gait is normal.  LABORATORY DATA: Labs reviewed from 09/04/16: cholesterol 208, triglycerides 79, HDL 71, LDL 121. CMET normal.  Assessment / Plan:  1. HTN- well controlled with  Benazepril only.  2. Paroxysmal atrial fibrillation. ? Now premanent. Patient thinks she is in and out but last 3 visits here she has been in Afib. Rate is controlled on no medication.  Her CHAD-Vasc score is 3 based on her age, hypertension, and female sex. Continue Xarelto. Will not pursue further rate control since rate OK on no therapy. She has history of intolerance to beta blocker. She is asymptomatic.   3. Hyperlipidemia.  LDL is mildly elevated. We discussed lipid lowering therapy but she would like to try dietary modification first. If no improvement I would recommend statin therapy.

## 2016-11-16 ENCOUNTER — Encounter: Payer: Self-pay | Admitting: Cardiology

## 2016-11-16 ENCOUNTER — Ambulatory Visit (INDEPENDENT_AMBULATORY_CARE_PROVIDER_SITE_OTHER): Payer: Medicare PPO | Admitting: Cardiology

## 2016-11-16 VITALS — BP 135/80 | HR 85 | Wt 136.8 lb

## 2016-11-16 DIAGNOSIS — I10 Essential (primary) hypertension: Secondary | ICD-10-CM

## 2016-11-16 DIAGNOSIS — E7800 Pure hypercholesterolemia, unspecified: Secondary | ICD-10-CM | POA: Diagnosis not present

## 2016-11-16 DIAGNOSIS — I480 Paroxysmal atrial fibrillation: Secondary | ICD-10-CM | POA: Diagnosis not present

## 2016-11-16 NOTE — Patient Instructions (Signed)
Continue your current therapy  I will see you in 6 months.   

## 2017-05-06 ENCOUNTER — Encounter: Payer: Self-pay | Admitting: Cardiology

## 2017-05-20 NOTE — Progress Notes (Signed)
Linda Snow Date of Birth: 01/13/1945 Medical Record #[id number]  History of Present Illness: Linda Snow is seen for  followup Afib. She has a history of paroxysmal atrial fibrillation, hypertension, and hypercholesterolemia. She also has a family history of early coronary disease. She has had an elevated CRP level in the past.  On  followup today she states she is doing very well. She is not aware of her heart beating irregular or fast. No chest pain or SOB. Stays active. No recent illnesses. She has a history of intolerance to metoprolol with hair loss.  Current Outpatient Prescriptions on File Prior to Visit  Medication Sig Dispense Refill  . benazepril (LOTENSIN) 10 MG tablet Take 1 tablet (10 mg total) by mouth daily. 90 tablet 3  . Cholecalciferol (VITAMIN D) 2000 UNITS CAPS Take by mouth daily.      . fish oil-omega-3 fatty acids 1000 MG capsule Take 1 g by mouth daily. Taking 1200 daily     . Multiple Vitamin (MULTIVITAMIN) capsule Take 1 capsule by mouth daily.      . vitamin C (ASCORBIC ACID) 500 MG tablet Take 500 mg by mouth daily.      [name] 20 MG TABS tablet TAKE 1 TABLET (20 MG TOTAL) BY MOUTH DAILY WITH SUPPER. 90 tablet 3   No current facility-administered medications on file prior to visit.     Allergies  Allergen Reactions  . Metoprolol Other (See Comments)    hairloss    Past Medical History:  Diagnosis Date  . Elevated C-reactive protein (CRP)   . Hyperlipidemia   . Hypertension   . PAF (paroxysmal atrial fibrillation) (HCC)    hx of,echo 06/09/2010 normal LV function and normal atrial size    No past surgical history on file.  History  Smoking Status  . Never Smoker  Smokeless Tobacco  . Never Used    History  Alcohol Use No    Family History  Problem Relation Age of Onset  . CVA Mother   . Heart attack Mother   . Hypertension Mother   . Heart attack Father   . Heart attack Brother     Review of Systems: As noted in HPI.  All other systems were reviewed and are negative.  Physical Exam: BP 124/82   Pulse (!) 124   Ht 5\' 6"  (1.676 m)   Wt 139 lb (63 kg)   BMI 22.44 kg/m   The patient is alert and oriented x 3.   The skin is warm and dry.   The HEENT exam is normal. The carotids are 2+ without bruits.  There is no thyromegaly.  There is no JVD.  The lungs are clear. The heart exam reveals an irregular and fast rate with a normal S1 and S2.  There are no murmurs, gallops, or rubs.  The PMI is not displaced.   Abdominal exam reveals good bowel sounds.   There is no hepatosplenomegaly or tenderness.  There are no masses.  Exam of the legs reveal no clubbing, cyanosis, or edema. The distal pulses are intact.  Cranial nerves II - XII are intact.  Motor and sensory functions are intact.  The gait is normal.  LABORATORY DATA: Labs reviewed from 09/04/16: cholesterol 208, triglycerides 79, HDL 71, LDL 121. CMET normal.  Ecg today shows Afib with RVR rate 124 bpm. Nonspecific ST T wave abnormality. I have personally reviewed and interpreted this study.   Assessment / Plan: 1. HTN- well controlled with  Benazepril only.  2. Atrial fibrillation. I suspect this is now premanent. She is really asymptomatic.  Her rate was controlled on no medication before but is elevated today. I recommend she wear a 24 hour Holter monitor to make sure HR control is adequate. If not will add Cardizem CD given her prior reaction to metoprolol.  Her CHAD-Vasc score is 3 based on her age, hypertension, and female sex. Continue Xarelto. She had negative Myoview and Echo in 2011.   3. Hyperlipidemia.  LDL is mildly elevated. We discussed lipid lowering therapy. She states she has been doing better with her diet and will have repeat lab work with Dr. Harrington Challenger.

## 2017-05-21 ENCOUNTER — Ambulatory Visit (INDEPENDENT_AMBULATORY_CARE_PROVIDER_SITE_OTHER): Payer: Medicare PPO | Admitting: Cardiology

## 2017-05-21 ENCOUNTER — Encounter: Payer: Self-pay | Admitting: Cardiology

## 2017-05-21 VITALS — BP 124/82 | HR 124 | Ht 66.0 in | Wt 139.0 lb

## 2017-05-21 DIAGNOSIS — E7800 Pure hypercholesterolemia, unspecified: Secondary | ICD-10-CM | POA: Diagnosis not present

## 2017-05-21 DIAGNOSIS — I4821 Permanent atrial fibrillation: Secondary | ICD-10-CM

## 2017-05-21 DIAGNOSIS — I4819 Other persistent atrial fibrillation: Secondary | ICD-10-CM

## 2017-05-21 DIAGNOSIS — I481 Persistent atrial fibrillation: Secondary | ICD-10-CM | POA: Diagnosis not present

## 2017-05-21 DIAGNOSIS — I482 Chronic atrial fibrillation: Secondary | ICD-10-CM

## 2017-05-21 NOTE — Patient Instructions (Signed)
We will have you wear a monitor for 24 hours.

## 2017-06-05 ENCOUNTER — Ambulatory Visit (INDEPENDENT_AMBULATORY_CARE_PROVIDER_SITE_OTHER): Payer: Medicare PPO

## 2017-06-05 DIAGNOSIS — E7800 Pure hypercholesterolemia, unspecified: Secondary | ICD-10-CM | POA: Diagnosis not present

## 2017-06-05 DIAGNOSIS — I4819 Other persistent atrial fibrillation: Secondary | ICD-10-CM

## 2017-06-05 DIAGNOSIS — I482 Chronic atrial fibrillation: Secondary | ICD-10-CM

## 2017-06-05 DIAGNOSIS — I4821 Permanent atrial fibrillation: Secondary | ICD-10-CM

## 2017-06-05 DIAGNOSIS — I481 Persistent atrial fibrillation: Secondary | ICD-10-CM | POA: Diagnosis not present

## 2017-06-14 ENCOUNTER — Other Ambulatory Visit: Payer: Self-pay

## 2017-06-14 MED ORDER — DILTIAZEM HCL 120 MG PO TABS: ORAL_TABLET | ORAL | 6 refills | Status: DC

## 2017-06-29 ENCOUNTER — Other Ambulatory Visit: Payer: Self-pay | Admitting: Cardiology

## 2017-07-01 NOTE — Telephone Encounter (Signed)
Last blood work available form 12/22/2014  Need recent BMET & CBC prior to refill authorization

## 2017-07-02 ENCOUNTER — Telehealth: Payer: Self-pay | Admitting: Cardiology

## 2017-07-02 MED ORDER — RIVAROXABAN 20 MG PO TABS: 20.0000 mg | ORAL_TABLET | Freq: Every day | ORAL | 0 refills | Status: DC

## 2017-07-02 NOTE — Telephone Encounter (Signed)
Spoke to patient. Informed Linda Snow is not in the office.  it looks as if she needs recent results. Patient states she had labs done at dr Gus Height office 08/04/16. She will bring acopy to office on 07/04/17.  she will pick  Up samples of xarelto x2

## 2017-07-02 NOTE — Telephone Encounter (Signed)
Patient no longer follow up by [place] provider DR Trudie Reed [phone]) contacted for copy of blood work)

## 2017-07-02 NOTE — Telephone Encounter (Signed)
New message   Pt states she is returning someone's call about blood work and medications. She is saying she has left numerous messages and hasnt gotten any calls back. Requests a call back as soon as possible.

## 2017-07-04 NOTE — Telephone Encounter (Signed)
PATIENT CAME BY TODAY PICKED UP SAMPLES , AND BROUGHT COPY OF LABS. LABS GIVEN TO PHARMACIST

## 2017-07-04 NOTE — Telephone Encounter (Signed)
Thank you. I received copy of her blood work this morning. Will refill Xarelto for 1 year.

## 2017-10-30 NOTE — Progress Notes (Deleted)
Debroah Snow Date of Birth: Nov 10, 1945 Medical Record #[id number]  History of Present Illness: Linda Snow is seen for  followup Afib. She has a history of paroxysmal atrial fibrillation, hypertension, and hypercholesterolemia. She also has a family history of early coronary disease. She has had an elevated CRP level in the past. Follow up Holter monitor in June showed HR was not well controled in the afternoon and she was started on Cardizem CD 120 mg daily.  On  followup today she states she is doing very well. She is not aware of her heart beating irregular or fast. No chest pain or SOB. Stays active. No recent illnesses. She has a history of intolerance to metoprolol with hair loss.  Current Outpatient Prescriptions on File Prior to Visit  Medication Sig Dispense Refill  . benazepril (LOTENSIN) 10 MG tablet Take 1 tablet (10 mg total) by mouth daily. 90 tablet 3  . Cholecalciferol (VITAMIN D) 2000 UNITS CAPS Take by mouth daily.      [name] diltiazem (CARDIZEM) 120 MG tablet Take 120 mg daily 30 tablet 6  . fish oil-omega-3 fatty acids 1000 MG capsule Take 1 g by mouth daily. Taking 1200 daily     . Multiple Vitamin (MULTIVITAMIN) capsule Take 1 capsule by mouth daily.      . rivaroxaban (XARELTO) 20 MG TABS tablet Take 1 tablet (20 mg total) by mouth daily with supper. 14 tablet 0  . vitamin C (ASCORBIC ACID) 500 MG tablet Take 500 mg by mouth daily.      [name] 20 MG TABS tablet TAKE 1 TABLET (20 MG TOTAL) BY MOUTH DAILY WITH SUPPER. 90 tablet 3   No current facility-administered medications on file prior to visit.     Allergies  Allergen Reactions  . Metoprolol Other (See Comments)    hairloss    Past Medical History:  Diagnosis Date  . Elevated C-reactive protein (CRP)   . Hyperlipidemia   . Hypertension   . PAF (paroxysmal atrial fibrillation) (HCC)    hx of,echo 06/09/2010 normal LV function and normal atrial size    No past surgical history on file.  History   Smoking Status  . Never Smoker  Smokeless Tobacco  . Never Used    History  Alcohol Use No    Family History  Problem Relation Age of Onset  . CVA Mother   . Heart attack Mother   . Hypertension Mother   . Heart attack Father   . Heart attack Brother     Review of Systems: As noted in HPI. All other systems were reviewed and are negative.  Physical Exam: There were no vitals taken for this visit.  The patient is alert and oriented x 3.   The skin is warm and dry.   The HEENT exam is normal. The carotids are 2+ without bruits.  There is no thyromegaly.  There is no JVD.  The lungs are clear. The heart exam reveals an irregular and fast rate with a normal S1 and S2.  There are no murmurs, gallops, or rubs.  The PMI is not displaced.   Abdominal exam reveals good bowel sounds.   There is no hepatosplenomegaly or tenderness.  There are no masses.  Exam of the legs reveal no clubbing, cyanosis, or edema. The distal pulses are intact.  Cranial nerves II - XII are intact.  Motor and sensory functions are intact.  The gait is normal.  LABORATORY DATA: Labs reviewed from 09/04/16: cholesterol  208, triglycerides 79, HDL 71, LDL 121. CMET normal.  Ecg today shows Afib with RVR rate 124 bpm. Nonspecific ST T wave abnormality. I have personally reviewed and interpreted this study.  Holter monitor 06/05/17:  Notes recorded by Martinique, Peter M, MD on 06/12/2017 at 3:15 PM EDT Monitor shows continuous Afib. Rate is not well controlled in the afternoon. I would recommend adding Cardizem CD 120 mg daily for better rate control. Prior intolerance to metoprolol.  Peter Martinique MD, [facility]      Vitals   Height Weight BMI (Calculated)  5\' 6"  (1.676 m) 139 lb (63 kg) 22.5  Study Highlights    Atrial fibrillation. Mean HR 99. HR highest in the afternoon with suboptial control  Rare PVCs.    Assessment / Plan: 1. HTN- well controlled with  Benazepril only.  2. Atrial fibrillation. I suspect  this is now premanent. She is really asymptomatic.  Her rate was controlled on no medication before but is elevated today. On Cardizem CD for rate control.  Her CHAD-Vasc score is 3 based on her age, hypertension, and female sex. Continue Xarelto. She had negative Myoview and Echo in 2011.   3. Hyperlipidemia.  LDL is mildly elevated. We discussed lipid lowering therapy. She states she has been doing better with her diet and will have repeat lab work with Dr. Harrington Challenger.

## 2017-10-31 ENCOUNTER — Telehealth: Payer: Self-pay | Admitting: Cardiology

## 2017-10-31 NOTE — Telephone Encounter (Signed)
New message  Pt call requesting to speak with RN. Pt states she made an appt for 11/16 at 9:30. She states the appt was incorrect and would like to be reschedule for a sooner appt than next available with only Dr. Martinique. Please call back to discuss

## 2017-10-31 NOTE — Telephone Encounter (Signed)
Appt made for 11-26 for pt

## 2017-11-05 ENCOUNTER — Ambulatory Visit: Payer: Medicare PPO | Admitting: Cardiology

## 2017-11-21 NOTE — Progress Notes (Signed)
Linda Snow Date of Birth: 1945/08/25 Medical Record #[id number]  History of Present Illness: Linda Snow is seen for  followup Afib. She has a history of paroxysmal atrial fibrillation, hypertension, and hypercholesterolemia. She also has a family history of early coronary disease. She has had an elevated CRP level in the past. She wore a Holter monitor in June 2018 which showed Afib with suboptimal HR control. Cardizem CD 120 mg daily was added.  On  followup today she states she is doing very well. She states she can tell a big difference with better rate control.  No chest pain or SOB. Stays active. She brings a list of BP readings and has some low readings below 948 systolic. Pulse is consistently in the 80s. She has a history of intolerance to metoprolol with hair loss.  Current Outpatient Medications on File Prior to Visit  Medication Sig Dispense Refill  . Cholecalciferol (VITAMIN D) 2000 UNITS CAPS Take by mouth daily.      [name] diltiazem (CARDIZEM) 120 MG tablet Take 120 mg daily 30 tablet 6  . fish oil-omega-3 fatty acids 1000 MG capsule Take 1 g by mouth daily. Taking 1200 daily     . Multiple Vitamin (MULTIVITAMIN) capsule Take 1 capsule by mouth daily.      . rivaroxaban (XARELTO) 20 MG TABS tablet Take 1 tablet (20 mg total) by mouth daily with supper. 14 tablet 0  . vitamin C (ASCORBIC ACID) 500 MG tablet Take 500 mg by mouth daily.       No current facility-administered medications on file prior to visit.     Allergies  Allergen Reactions  . Metoprolol Other (See Comments)    hairloss    Past Medical History:  Diagnosis Date  . Elevated C-reactive protein (CRP)   . Hyperlipidemia   . Hypertension   . PAF (paroxysmal atrial fibrillation) (HCC)    hx of,echo 06/09/2010 normal LV function and normal atrial size    History reviewed. No pertinent surgical history.  Social History   Tobacco Use  Smoking Status Never Smoker  Smokeless Tobacco Never Used     Social History   Substance and Sexual Activity  Alcohol Use No  . Alcohol/week: 0.0 oz    Family History  Problem Relation Age of Onset  . CVA Mother   . Heart attack Mother   . Hypertension Mother   . Heart attack Father   . Heart attack Brother     Review of Systems: As noted in HPI. All other systems were reviewed and are negative.  Physical Exam: BP 123/69   Pulse (!) 59   Ht 5' 6.5" (1.689 m)   Wt 138 lb 6.4 oz (62.8 kg)   SpO2 99%   BMI 22.00 kg/m   GENERAL:  Well appearing HEENT:  PERRL, EOMI, sclera are clear. Oropharynx is clear. NECK:  No jugular venous distention, carotid upstroke brisk and symmetric, no bruits, no thyromegaly or adenopathy LUNGS:  Clear to auscultation bilaterally CHEST:  Unremarkable HEART:  IRRR,  PMI not displaced or sustained,S1 and S2 within normal limits, no S3, no S4: no clicks, no rubs, no murmurs ABD:  Soft, nontender. BS +, no masses or bruits. No hepatomegaly, no splenomegaly EXT:  2 + pulses throughout, no edema, no cyanosis no clubbing SKIN:  Warm and dry.  No rashes NEURO:  Alert and oriented x 3. Cranial nerves II through XII intact. PSYCH:  Cognitively intact    LABORATORY DATA: Labs reviewed from  09/04/16: cholesterol 208, triglycerides 79, HDL 71, LDL 121. CMET normal. Dated 10/03/17: cholesterol 185, triglycerides 69. HDL 70, LDL 101. Chemistries normal.   Assessment / Plan: 1. HTN- well controlled but now with some low readings since adding Cardizem. Will lower benazepril dose to 5 mg daily.   2. Atrial fibrillation. now premanent. She is  asymptomatic.  HR control suboptimal based on Holter in June. Now on Cardizem CD with improved control.   Her CHAD-Vasc score is 3 based on her age, hypertension, and female sex. Continue Xarelto. She had negative Myoview and Echo in 2011.   3. Hyperlipidemia.  LDL is mildly elevated. Improved 20 points with dietary modification.

## 2017-11-25 ENCOUNTER — Ambulatory Visit (INDEPENDENT_AMBULATORY_CARE_PROVIDER_SITE_OTHER): Payer: Medicare PPO | Admitting: Cardiology

## 2017-11-25 ENCOUNTER — Encounter: Payer: Self-pay | Admitting: Cardiology

## 2017-11-25 VITALS — BP 123/69 | HR 59 | Ht 66.5 in | Wt 138.4 lb

## 2017-11-25 DIAGNOSIS — I482 Chronic atrial fibrillation: Secondary | ICD-10-CM | POA: Diagnosis not present

## 2017-11-25 DIAGNOSIS — E7800 Pure hypercholesterolemia, unspecified: Secondary | ICD-10-CM

## 2017-11-25 DIAGNOSIS — I4821 Permanent atrial fibrillation: Secondary | ICD-10-CM

## 2017-11-25 DIAGNOSIS — I10 Essential (primary) hypertension: Secondary | ICD-10-CM | POA: Diagnosis not present

## 2017-11-25 MED ORDER — BENAZEPRIL HCL 5 MG PO TABS: 5.0000 mg | ORAL_TABLET | Freq: Every day | ORAL | 3 refills | Status: DC

## 2017-11-25 NOTE — Patient Instructions (Signed)
We will reduce your benazepril to 5 mg daily  I will see you in 6 months

## 2017-12-10 ENCOUNTER — Other Ambulatory Visit: Payer: Self-pay | Admitting: Cardiology

## 2017-12-11 NOTE — Telephone Encounter (Signed)
Rx(s) sent to pharmacy electronically.  

## 2018-05-24 NOTE — Progress Notes (Signed)
Linda Snow Date of Birth: [birth year] Medical Record #[id number]  History of Present Illness: Linda Snow is seen for  followup Afib. She has a history of paroxysmal atrial fibrillation, hypertension, and hypercholesterolemia. She also has a family history of early coronary disease. She has had an elevated CRP level in the past. She wore a Holter monitor in June 2018 which showed Afib with suboptimal HR control. Cardizem CD 120 mg daily was added.  On  followup today she states she is doing very well. She is still aware sometimes of her Afib but HR control and BP control have been excellent. No chest pain, dyspnea, dizziness. No edema. She stays very active with water aerobics 3x/week and walking daily.  Current Outpatient Medications on File Prior to Visit  Medication Sig Dispense Refill  . benazepril (LOTENSIN) 5 MG tablet Take 1 tablet (5 mg total) by mouth daily. 90 tablet 3  . Cholecalciferol (VITAMIN D) 2000 UNITS CAPS Take by mouth daily.      [name] diltiazem (CARDIZEM) 120 MG tablet TAKE 1 TABLET BY MOUTH EVERY DAY 30 tablet 6  . fish oil-omega-3 fatty acids 1000 MG capsule Take 1 g by mouth daily. Taking 1200 daily     . Multiple Vitamin (MULTIVITAMIN) capsule Take 1 capsule by mouth daily.      . rivaroxaban (XARELTO) 20 MG TABS tablet Take 1 tablet (20 mg total) by mouth daily with supper. 14 tablet 0  . vitamin C (ASCORBIC ACID) 500 MG tablet Take 500 mg by mouth daily.       No current facility-administered medications on file prior to visit.     Allergies  Allergen Reactions  . Metoprolol Other (See Comments)    hairloss    Past Medical History:  Diagnosis Date  . Elevated C-reactive protein (CRP)   . Hyperlipidemia   . Hypertension   . PAF (paroxysmal atrial fibrillation) (HCC)    hx of,echo 06/09/2010 normal LV function and normal atrial size    History reviewed. No pertinent surgical history.  Social History   Tobacco Use  Smoking Status Never Smoker   Smokeless Tobacco Never Used    Social History   Substance and Sexual Activity  Alcohol Use No  . Alcohol/week: 0.0 oz    Family History  Problem Relation Age of Onset  . CVA Mother   . Heart attack Mother   . Hypertension Mother   . Heart attack Father   . Heart attack Brother     Review of Systems: As noted in HPI. All other systems were reviewed and are negative.  Physical Exam: BP (!) 134/94   Pulse 86   Ht 5' 6.5" (1.689 m)   Wt 141 lb 9.6 oz (64.2 kg)   BMI 22.51 kg/m   GENERAL:  Well appearing WF in NAD HEENT:  PERRL, EOMI, sclera are clear. Oropharynx is clear. NECK:  No jugular venous distention, carotid upstroke brisk and symmetric, no bruits, no thyromegaly or adenopathy LUNGS:  Clear to auscultation bilaterally CHEST:  Unremarkable HEART:  IRRR,  PMI not displaced or sustained,S1 and S2 within normal limits, no S3, no S4: no clicks, no rubs, no murmurs ABD:  Soft, nontender. BS +, no masses or bruits. No hepatomegaly, no splenomegaly EXT:  2 + pulses throughout, no edema, no cyanosis no clubbing SKIN:  Warm and dry.  No rashes NEURO:  Alert and oriented x 3. Cranial nerves II through XII intact. PSYCH:  Cognitively intact    LABORATORY  DATA: Labs reviewed from 09/04/16: cholesterol 208, triglycerides 79, HDL 71, LDL 121. CMET normal. Dated 10/03/17: cholesterol 185, triglycerides 69. HDL 70, LDL 101. Chemistries normal.  Ecg today shows Afib with rate 86. Nonspecific ST-T wave abnormality. I have personally reviewed and interpreted this study.  Assessment / Plan: 1. HTN- well controlled   2. Atrial fibrillation. now premanent. She is  asymptomatic.  HR control is good  on Cardizem CD.   Her CHAD-Vasc score is 3 based on her age, hypertension, and female sex. Continue Xarelto. She had negative Myoview and Echo in 2011.   3. Hyperlipidemia.  LDL is mildly elevated. Improved 20 points with dietary modification.

## 2018-05-28 ENCOUNTER — Encounter: Payer: Self-pay | Admitting: Cardiology

## 2018-05-28 ENCOUNTER — Ambulatory Visit (INDEPENDENT_AMBULATORY_CARE_PROVIDER_SITE_OTHER): Payer: Medicare PPO | Admitting: Cardiology

## 2018-05-28 VITALS — BP 134/94 | HR 86 | Ht 66.5 in | Wt 141.6 lb

## 2018-05-28 DIAGNOSIS — E7800 Pure hypercholesterolemia, unspecified: Secondary | ICD-10-CM | POA: Diagnosis not present

## 2018-05-28 DIAGNOSIS — I482 Chronic atrial fibrillation: Secondary | ICD-10-CM | POA: Diagnosis not present

## 2018-05-28 DIAGNOSIS — I10 Essential (primary) hypertension: Secondary | ICD-10-CM

## 2018-05-28 DIAGNOSIS — I4821 Permanent atrial fibrillation: Secondary | ICD-10-CM

## 2018-05-28 NOTE — Patient Instructions (Signed)
Continue your current therapy  I will see you in 6 months.   

## 2018-06-22 ENCOUNTER — Other Ambulatory Visit: Payer: Self-pay | Admitting: Cardiology

## 2018-07-22 ENCOUNTER — Other Ambulatory Visit: Payer: Self-pay | Admitting: Cardiology

## 2018-11-28 NOTE — Progress Notes (Signed)
Linda Snow Date of Birth: March 03, 1945 Medical Record #[id number]  History of Present Illness: Linda Snow is seen for  followup Afib. She has a history of paroxysmal atrial fibrillation, hypertension, and hypercholesterolemia. She also has a family history of early coronary disease. She has had an elevated CRP level in the past. She wore a Holter monitor in June 2018 which showed Afib with suboptimal HR control. Cardizem CD 120 mg daily was added.  On  followup today she states she is doing very well. She is really not aware of her Afib. She brings BP and HR readings from home and BP has been well controlled and HR generally 80-90.  No chest pain, dyspnea, dizziness. No edema. She continues to be active with water aerobics 3x/week and walking daily.  Current Outpatient Medications on File Prior to Visit  Medication Sig Dispense Refill  . benazepril (LOTENSIN) 5 MG tablet Take 1 tablet (5 mg total) by mouth daily. 90 tablet 3  . Cholecalciferol (VITAMIN D) 2000 UNITS CAPS Take by mouth daily.      [name] diltiazem (CARDIZEM) 120 MG tablet TAKE 1 TABLET BY MOUTH EVERY DAY 30 tablet 10  . fish oil-omega-3 fatty acids 1000 MG capsule Take 1 g by mouth daily. Taking 1200 daily     . Multiple Vitamin (MULTIVITAMIN) capsule Take 1 capsule by mouth daily.      . rivaroxaban (XARELTO) 20 MG TABS tablet Take 1 tablet (20 mg total) by mouth daily with supper. 14 tablet 0  . vitamin C (ASCORBIC ACID) 500 MG tablet Take 500 mg by mouth daily.      [name] 20 MG TABS tablet TAKE 1 TABLET (20 MG TOTAL) BY MOUTH DAILY WITH SUPPER. 90 tablet 3   No current facility-administered medications on file prior to visit.     Allergies  Allergen Reactions  . Metoprolol Other (See Comments)    hairloss    Past Medical History:  Diagnosis Date  . Elevated C-reactive protein (CRP)   . Hyperlipidemia   . Hypertension   . PAF (paroxysmal atrial fibrillation) (HCC)    hx of,echo 06/09/2010 normal LV function  and normal atrial size    No past surgical history on file.  Social History   Tobacco Use  Smoking Status Never Smoker  Smokeless Tobacco Never Used    Social History   Substance and Sexual Activity  Alcohol Use No  . Alcohol/week: 0.0 standard drinks    Family History  Problem Relation Age of Onset  . CVA Mother   . Heart attack Mother   . Hypertension Mother   . Heart attack Father   . Heart attack Brother     Review of Systems: As noted in HPI. All other systems were reviewed and are negative.  Physical Exam: There were no vitals taken for this visit. GENERAL:  Well appearing WF in NAD HEENT:  PERRL, EOMI, sclera are clear. Oropharynx is clear. NECK:  No jugular venous distention, carotid upstroke brisk and symmetric, no bruits, no thyromegaly or adenopathy LUNGS:  Clear to auscultation bilaterally CHEST:  Unremarkable HEART:  IRRR,  PMI not displaced or sustained,S1 and S2 within normal limits, no S3, no S4: no clicks, no rubs, no murmurs ABD:  Soft, nontender. BS +, no masses or bruits. No hepatomegaly, no splenomegaly EXT:  2 + pulses throughout, no edema, no cyanosis no clubbing SKIN:  Warm and dry.  No rashes NEURO:  Alert and oriented x 3. Cranial nerves II  through XII intact. PSYCH:  Cognitively intact     LABORATORY DATA: Labs reviewed from 09/04/16: cholesterol 208, triglycerides 79, HDL 71, LDL 121. CMET normal. Dated 10/03/17: cholesterol 185, triglycerides 69. HDL 70, LDL 101. Chemistries normal. Dated 11/13/18: cholesterol 199, triglycerides 62, HDL 75, LDL 111. CMET normal.  Assessment / Plan: 1. HTN- well controlled   2. Atrial fibrillation. now premanent. She is  asymptomatic.  HR control is good  on Cardizem CD.   Her CHAD-Vasc score is 3 based on her age, hypertension, and female sex. Continue Xarelto.   3. Hyperlipidemia.  LDL is mildly elevated. Continue dietary modification.

## 2018-12-03 ENCOUNTER — Encounter: Payer: Self-pay | Admitting: Cardiology

## 2018-12-03 ENCOUNTER — Ambulatory Visit (INDEPENDENT_AMBULATORY_CARE_PROVIDER_SITE_OTHER): Payer: Medicare PPO | Admitting: Cardiology

## 2018-12-03 VITALS — BP 140/86 | HR 86 | Ht 66.0 in | Wt 142.5 lb

## 2018-12-03 DIAGNOSIS — I10 Essential (primary) hypertension: Secondary | ICD-10-CM | POA: Diagnosis not present

## 2018-12-03 DIAGNOSIS — I4821 Permanent atrial fibrillation: Secondary | ICD-10-CM

## 2018-12-16 ENCOUNTER — Other Ambulatory Visit: Payer: Self-pay | Admitting: Cardiology

## 2019-01-23 DIAGNOSIS — H35342 Macular cyst, hole, or pseudohole, left eye: Secondary | ICD-10-CM | POA: Diagnosis not present

## 2019-01-23 DIAGNOSIS — H26491 Other secondary cataract, right eye: Secondary | ICD-10-CM | POA: Diagnosis not present

## 2019-01-28 DIAGNOSIS — Z0100 Encounter for examination of eyes and vision without abnormal findings: Secondary | ICD-10-CM | POA: Diagnosis not present

## 2019-01-28 DIAGNOSIS — H903 Sensorineural hearing loss, bilateral: Secondary | ICD-10-CM | POA: Diagnosis not present

## 2019-01-28 DIAGNOSIS — Z822 Family history of deafness and hearing loss: Secondary | ICD-10-CM | POA: Diagnosis not present

## 2019-01-29 ENCOUNTER — Telehealth: Payer: Self-pay | Admitting: *Deleted

## 2019-01-29 NOTE — Telephone Encounter (Signed)
   [facility] Pre-operative Risk Assessment    Request for surgical clearance:  1. What type of surgery is being performed? colonoscopy   2. When is this surgery scheduled? 03-05-2019   3. What type of clearance is required (medical clearance vs. Pharmacy clearance to hold med vs. Both)? both  4. Are there any medications that need to be held prior to surgery and how long?xarelto   5. Practice name and name of physician performing surgery? Dr Alessandra Bevels   6. What is your office phone number [phone]    7.   What is your office fax number [phone]  8.   Anesthesia type (None, local, MAC, general) ? unknown   Fredia Beets 01/29/2019, 5:41 PM  _________________________________________________________________   (provider comments below)

## 2019-02-05 NOTE — Telephone Encounter (Signed)
Patient takes Xarelto for afib with CHADS2VASc score of 3 (age, sex, HTN). Renal functio is normal per [facility] Nov 2019 labs. Ok to hold Xarelto for 1-2 days prior to colonoscopy.

## 2019-02-05 NOTE — Telephone Encounter (Signed)
   Primary Cardiologist: Peter Martinique, MD  Chart reviewed as part of pre-operative protocol coverage. Patient was contacted 02/05/2019 in reference to pre-operative risk assessment for pending surgery as outlined below.  Linda Snow was last seen on 12/03/18 by Dr. Martinique.  Since that day, Linda Snow has done well without pain or SOB.  No Known CAD.   OK to hold Xarelto 1-2 days prior to colonoscopy.   Therefore, based on ACC/AHA guidelines, the patient would be at acceptable risk for the planned procedure without further cardiovascular testing.   I will route this recommendation to the requesting party via Epic fax function and remove from pre-op pool.  Please call with questions.  Cecilie Kicks, NP 02/05/2019, 11:51 AM

## 2019-02-05 NOTE — Telephone Encounter (Signed)
Pharm please address xarelto

## 2019-02-12 DIAGNOSIS — H26491 Other secondary cataract, right eye: Secondary | ICD-10-CM | POA: Diagnosis not present

## 2019-02-13 ENCOUNTER — Encounter: Payer: Self-pay | Admitting: Cardiology

## 2019-02-13 NOTE — Telephone Encounter (Signed)
error 

## 2019-02-19 DIAGNOSIS — H35352 Cystoid macular degeneration, left eye: Secondary | ICD-10-CM | POA: Diagnosis not present

## 2019-02-19 DIAGNOSIS — I4891 Unspecified atrial fibrillation: Secondary | ICD-10-CM | POA: Diagnosis not present

## 2019-02-19 DIAGNOSIS — H35342 Macular cyst, hole, or pseudohole, left eye: Secondary | ICD-10-CM | POA: Diagnosis not present

## 2019-02-19 DIAGNOSIS — Z961 Presence of intraocular lens: Secondary | ICD-10-CM | POA: Diagnosis not present

## 2019-03-04 DIAGNOSIS — H35342 Macular cyst, hole, or pseudohole, left eye: Secondary | ICD-10-CM | POA: Diagnosis not present

## 2019-03-23 ENCOUNTER — Other Ambulatory Visit: Payer: Self-pay

## 2019-03-23 MED ORDER — RIVAROXABAN 20 MG PO TABS: 20.0000 mg | ORAL_TABLET | Freq: Every day | ORAL | 0 refills | Status: DC

## 2019-03-24 ENCOUNTER — Other Ambulatory Visit: Payer: Self-pay | Admitting: *Deleted

## 2019-03-24 MED ORDER — RIVAROXABAN 20 MG PO TABS: 20.0000 mg | ORAL_TABLET | Freq: Every day | ORAL | 1 refills | Status: DC

## 2019-03-24 NOTE — Telephone Encounter (Signed)
73yo, 142 lbs, Scr 0.68 on 11/13/18 from [facility] Crcl 34ml/min Last OV 12/03/18

## 2019-03-25 DIAGNOSIS — H903 Sensorineural hearing loss, bilateral: Secondary | ICD-10-CM | POA: Diagnosis not present

## 2019-03-25 DIAGNOSIS — R42 Dizziness and giddiness: Secondary | ICD-10-CM | POA: Diagnosis not present

## 2019-04-01 ENCOUNTER — Other Ambulatory Visit: Payer: Self-pay | Admitting: Otolaryngology

## 2019-04-01 DIAGNOSIS — H918X9 Other specified hearing loss, unspecified ear: Secondary | ICD-10-CM

## 2019-04-01 DIAGNOSIS — H918X3 Other specified hearing loss, bilateral: Secondary | ICD-10-CM

## 2019-04-08 ENCOUNTER — Ambulatory Visit
Admission: RE | Admit: 2019-04-08 | Discharge: 2019-04-08 | Disposition: A | Discharge status: Home / Self Care | Payer: Medicare HMO | Source: Ambulatory Visit | Attending: Otolaryngology | Admitting: Otolaryngology

## 2019-04-08 ENCOUNTER — Other Ambulatory Visit: Payer: Self-pay

## 2019-04-08 DIAGNOSIS — H918X9 Other specified hearing loss, unspecified ear: Secondary | ICD-10-CM | POA: Diagnosis not present

## 2019-04-08 MED ORDER — GADOBENATE DIMEGLUMINE 529 MG/ML IV SOLN
10.0000 mL | Freq: Once | INTRAVENOUS | Status: AC | PRN
  Administered 2019-04-08: 10 mL via INTRAVENOUS

## 2019-04-30 DIAGNOSIS — Z09 Encounter for follow-up examination after completed treatment for conditions other than malignant neoplasm: Secondary | ICD-10-CM | POA: Diagnosis not present

## 2019-04-30 DIAGNOSIS — H35342 Macular cyst, hole, or pseudohole, left eye: Secondary | ICD-10-CM | POA: Diagnosis not present

## 2019-05-21 DIAGNOSIS — R69 Illness, unspecified: Secondary | ICD-10-CM | POA: Diagnosis not present

## 2019-05-24 ENCOUNTER — Other Ambulatory Visit: Payer: Self-pay | Admitting: Cardiology

## 2019-06-01 ENCOUNTER — Ambulatory Visit: Payer: Medicare PPO | Admitting: Cardiology

## 2019-06-02 DIAGNOSIS — Z803 Family history of malignant neoplasm of breast: Secondary | ICD-10-CM | POA: Diagnosis not present

## 2019-06-02 DIAGNOSIS — M8589 Other specified disorders of bone density and structure, multiple sites: Secondary | ICD-10-CM | POA: Diagnosis not present

## 2019-06-02 DIAGNOSIS — Z1231 Encounter for screening mammogram for malignant neoplasm of breast: Secondary | ICD-10-CM | POA: Diagnosis not present

## 2019-06-08 DIAGNOSIS — R69 Illness, unspecified: Secondary | ICD-10-CM | POA: Diagnosis not present

## 2019-06-15 ENCOUNTER — Other Ambulatory Visit: Payer: Self-pay | Admitting: Cardiology

## 2019-06-17 ENCOUNTER — Telehealth: Payer: Self-pay | Admitting: Cardiology

## 2019-06-17 MED ORDER — BENAZEPRIL HCL 5 MG PO TABS: 5.0000 mg | ORAL_TABLET | Freq: Every day | ORAL | 0 refills | Status: DC

## 2019-06-17 NOTE — Telephone Encounter (Signed)
°*  STAT* If patient is at the pharmacy, call can be transferred to refill team.   1. Which medications need to be refilled? (please list name of each medication and dose if known) Benazepril 5  mg  2. Which pharmacy/location (including street and city if local pharmacy) is medication to be sent to? [facility]   3. Do they need a 30 day or 90 day supply? 90 patient went to pick up medication and it was for 30 patient gets 90 because insurance will for 90. However patient states they wanted her to see the doctor she had appt 06/01/19 patient came and told her he was out of town and she has a up coming appt 07/09/19 at 1:40

## 2019-06-17 NOTE — Telephone Encounter (Signed)
This is Dr. Jordan's pt. °

## 2019-06-17 NOTE — Telephone Encounter (Signed)
Called and notified patient that RX was sent for 90 day since patient has appointment scheduled and notified her to keep that appointment.  Patient verbalized understanding.

## 2019-07-06 NOTE — Progress Notes (Signed)
Linda Snow Date of Birth: 01-28-1945 Medical Record #[id number]  History of Present Illness: Linda Snow is seen for  followup Afib. She has a history of paroxysmal atrial fibrillation, hypertension, and hypercholesterolemia. She also has a family history of early coronary disease. She has had an elevated CRP level in the past. She wore a Holter monitor in June 2018 which showed Afib with suboptimal HR control. Cardizem CD 120 mg daily was added.   On  followup today she states she is doing very well from a cardiac standpoint. She is really not aware of her Afib. She brings BP and HR readings from home and BP has been well controlled and HR generally 80-90.  No chest pain, dyspnea. No edema. She continues to be active with  walking daily. She has had issues with nausea, dizziness, hearing loss. May have Meniere's disease. Extensive work up with GI and Neuro.  Current Outpatient Medications on File Prior to Visit  Medication Sig Dispense Refill  . benazepril (LOTENSIN) 5 MG tablet Take 1 tablet (5 mg total) by mouth daily. Keep follow up appointment as scheduled. 90 tablet 0  . Cholecalciferol (VITAMIN D) 2000 UNITS CAPS Take by mouth daily.      [name] diltiazem (CARDIZEM) 120 MG tablet TAKE 1 TABLET BY MOUTH EVERY DAY 90 tablet 3  . fish oil-omega-3 fatty acids 1000 MG capsule Take 1 g by mouth daily. Taking 1200 daily     . Multiple Vitamin (MULTIVITAMIN) capsule Take 1 capsule by mouth daily.      . rivaroxaban (XARELTO) 20 MG TABS tablet Take 1 tablet (20 mg total) by mouth daily with supper. 90 tablet 1  . vitamin C (ASCORBIC ACID) 500 MG tablet Take 500 mg by mouth daily.       No current facility-administered medications on file prior to visit.     Allergies  Allergen Reactions  . Metoprolol Other (See Comments)    hairloss    Past Medical History:  Diagnosis Date  . Elevated C-reactive protein (CRP)   . Hyperlipidemia   . Hypertension   . PAF (paroxysmal atrial  fibrillation) (HCC)    hx of,echo 06/09/2010 normal LV function and normal atrial size    History reviewed. No pertinent surgical history.  Social History   Tobacco Use  Smoking Status Never Smoker  Smokeless Tobacco Never Used    Social History   Substance and Sexual Activity  Alcohol Use No  . Alcohol/week: 0.0 standard drinks    Family History  Problem Relation Age of Onset  . CVA Mother   . Heart attack Mother   . Hypertension Mother   . Heart attack Father   . Heart attack Brother     Review of Systems: As noted in HPI. All other systems were reviewed and are negative.  Physical Exam: BP 139/88   Pulse 85   Ht 5\' 6"  (1.676 m)   Wt 141 lb 12.8 oz (64.3 kg)   SpO2 97%   BMI 22.89 kg/m  GENERAL:  Well appearing WF in NAD HEENT:  PERRL, EOMI, sclera are clear. Oropharynx is clear. NECK:  No jugular venous distention, carotid upstroke brisk and symmetric, no bruits, no thyromegaly or adenopathy LUNGS:  Clear to auscultation bilaterally CHEST:  Unremarkable HEART:  IRRR,  PMI not displaced or sustained,S1 and S2 within normal limits, no S3, no S4: no clicks, no rubs, no murmurs ABD:  Soft, nontender. BS +, no masses or bruits. No hepatomegaly, no splenomegaly  EXT:  2 + pulses throughout, no edema, no cyanosis no clubbing SKIN:  Warm and dry.  No rashes NEURO:  Alert and oriented x 3. Cranial nerves II through XII intact. PSYCH:  Cognitively intact   LABORATORY DATA: Labs reviewed from 09/04/16: cholesterol 208, triglycerides 79, HDL 71, LDL 121. CMET normal. Dated 10/03/17: cholesterol 185, triglycerides 69. HDL 70, LDL 101. Chemistries normal. Dated 11/13/18: cholesterol 199, triglycerides 62, HDL 75, LDL 111. CMET normal.  Ecg today shows NSR with rate 85. Nonspecific ST abnormality. I have personally reviewed and interpreted this study.   Assessment / Plan: 1. HTN- well controlled   2. Atrial fibrillation. now premanent. She is  asymptomatic.  HR control  is good  on Cardizem CD.   Her CHAD-Vasc score is 3. Continue Xarelto.   3. Hyperlipidemia.  LDL is mildly elevated. Continue dietary modification.

## 2019-07-08 ENCOUNTER — Telehealth: Payer: Self-pay | Admitting: Cardiology

## 2019-07-08 NOTE — Telephone Encounter (Signed)

## 2019-07-09 ENCOUNTER — Encounter: Payer: Self-pay | Admitting: Cardiology

## 2019-07-09 ENCOUNTER — Ambulatory Visit: Payer: Medicare HMO | Admitting: Cardiology

## 2019-07-09 ENCOUNTER — Other Ambulatory Visit: Payer: Self-pay

## 2019-07-09 VITALS — BP 139/88 | HR 85 | Ht 66.0 in | Wt 141.8 lb

## 2019-07-09 DIAGNOSIS — I4821 Permanent atrial fibrillation: Secondary | ICD-10-CM

## 2019-07-09 DIAGNOSIS — E7800 Pure hypercholesterolemia, unspecified: Secondary | ICD-10-CM

## 2019-07-09 DIAGNOSIS — I10 Essential (primary) hypertension: Secondary | ICD-10-CM | POA: Diagnosis not present

## 2019-07-21 DIAGNOSIS — Z01419 Encounter for gynecological examination (general) (routine) without abnormal findings: Secondary | ICD-10-CM | POA: Diagnosis not present

## 2019-08-03 DIAGNOSIS — D23112 Other benign neoplasm of skin of right lower eyelid, including canthus: Secondary | ICD-10-CM | POA: Diagnosis not present

## 2019-09-08 ENCOUNTER — Other Ambulatory Visit: Payer: Self-pay | Admitting: Cardiology

## 2019-10-02 DIAGNOSIS — R69 Illness, unspecified: Secondary | ICD-10-CM | POA: Diagnosis not present

## 2019-11-05 ENCOUNTER — Telehealth: Payer: Self-pay | Admitting: *Deleted

## 2019-11-05 NOTE — Telephone Encounter (Signed)
   Primary Cardiologist: Peter Martinique, MD  Chart reviewed as part of pre-operative protocol coverage. Given past medical history and time since last visit, based on ACC/AHA guidelines, Linda Snow would be at acceptable risk for the planned procedure without further cardiovascular testing.   Patient with diagnosis of afib on Xarelto for anticoagulation.   Procedure: colonoscopy Date of procedure: December  CHADS2-VASc score of  4 (HTN, AGE,  AGE, female)  CrCl 72 ml/min  Per office protocol, patient can hold Xarelto for 1-2 days prior to procedure.    I will route this recommendation to the requesting party via Epic fax function and remove from pre-op pool.  Please call with questions.  Kathyrn Drown, NP 11/05/2019, 12:16 PM

## 2019-11-05 NOTE — Telephone Encounter (Signed)
   [facility] Pre-operative Risk Assessment    Request for surgical clearance:  1. What type of surgery is being performed? COLONOSCOPY    2. When is this surgery scheduled? December    3. What type of clearance is required (medical clearance vs. Pharmacy clearance to hold med vs. Both)?    4. Are there any medications that need to be held prior to surgery and how long?Linda Snow    5. Practice name and name of physician performing surgery? [facility]    6. What is your office phone number? [phone]    7.   What is your office fax number? [phone]  8.   Anesthesia type (None, local, MAC, general) ? PROPOFOL

## 2019-11-05 NOTE — Telephone Encounter (Signed)
Patient with diagnosis of afib on Xarelto for anticoagulation.    Procedure: colonoscopy Date of procedure: December  CHADS2-VASc score of  4 (HTN, AGE,  AGE, female)  CrCl 72 ml/min  Per office protocol, patient can hold Xarelto for 1-2 days prior to procedure.

## 2019-11-23 DIAGNOSIS — R69 Illness, unspecified: Secondary | ICD-10-CM | POA: Diagnosis not present

## 2019-12-01 DIAGNOSIS — Z1159 Encounter for screening for other viral diseases: Secondary | ICD-10-CM | POA: Diagnosis not present

## 2019-12-04 DIAGNOSIS — K648 Other hemorrhoids: Secondary | ICD-10-CM | POA: Diagnosis not present

## 2019-12-04 DIAGNOSIS — Z1211 Encounter for screening for malignant neoplasm of colon: Secondary | ICD-10-CM | POA: Diagnosis not present

## 2019-12-04 DIAGNOSIS — D120 Benign neoplasm of cecum: Secondary | ICD-10-CM | POA: Diagnosis not present

## 2019-12-04 DIAGNOSIS — Q438 Other specified congenital malformations of intestine: Secondary | ICD-10-CM | POA: Diagnosis not present

## 2019-12-04 DIAGNOSIS — K5730 Diverticulosis of large intestine without perforation or abscess without bleeding: Secondary | ICD-10-CM | POA: Diagnosis not present

## 2019-12-08 DIAGNOSIS — D120 Benign neoplasm of cecum: Secondary | ICD-10-CM | POA: Diagnosis not present

## 2019-12-10 ENCOUNTER — Other Ambulatory Visit: Payer: Self-pay | Admitting: Cardiology

## 2019-12-10 DIAGNOSIS — R69 Illness, unspecified: Secondary | ICD-10-CM | POA: Diagnosis not present

## 2019-12-10 NOTE — Telephone Encounter (Signed)
74 F 64.3 kg, SCr 0.68 (10/2018)  CrCl - 72.6 Will need updates SCr for next refill - with COVID will not ask her to go to lab at this time

## 2019-12-10 NOTE — Telephone Encounter (Signed)
Please review for refill, Thanks !  

## 2019-12-18 ENCOUNTER — Other Ambulatory Visit: Payer: Self-pay | Admitting: Cardiology

## 2019-12-18 DIAGNOSIS — E7800 Pure hypercholesterolemia, unspecified: Secondary | ICD-10-CM | POA: Diagnosis not present

## 2019-12-18 DIAGNOSIS — I4891 Unspecified atrial fibrillation: Secondary | ICD-10-CM | POA: Diagnosis not present

## 2019-12-18 DIAGNOSIS — Z0000 Encounter for general adult medical examination without abnormal findings: Secondary | ICD-10-CM | POA: Diagnosis not present

## 2019-12-18 DIAGNOSIS — D6859 Other primary thrombophilia: Secondary | ICD-10-CM | POA: Diagnosis not present

## 2019-12-18 DIAGNOSIS — E559 Vitamin D deficiency, unspecified: Secondary | ICD-10-CM | POA: Diagnosis not present

## 2019-12-18 DIAGNOSIS — Z79899 Other long term (current) drug therapy: Secondary | ICD-10-CM | POA: Diagnosis not present

## 2019-12-18 DIAGNOSIS — I10 Essential (primary) hypertension: Secondary | ICD-10-CM | POA: Diagnosis not present

## 2019-12-22 DIAGNOSIS — I10 Essential (primary) hypertension: Secondary | ICD-10-CM | POA: Diagnosis not present

## 2019-12-22 DIAGNOSIS — D6859 Other primary thrombophilia: Secondary | ICD-10-CM | POA: Diagnosis not present

## 2019-12-22 DIAGNOSIS — E559 Vitamin D deficiency, unspecified: Secondary | ICD-10-CM | POA: Diagnosis not present

## 2019-12-22 DIAGNOSIS — Z0000 Encounter for general adult medical examination without abnormal findings: Secondary | ICD-10-CM | POA: Diagnosis not present

## 2019-12-22 DIAGNOSIS — E7800 Pure hypercholesterolemia, unspecified: Secondary | ICD-10-CM | POA: Diagnosis not present

## 2019-12-22 DIAGNOSIS — Z79899 Other long term (current) drug therapy: Secondary | ICD-10-CM | POA: Diagnosis not present

## 2019-12-22 DIAGNOSIS — I4891 Unspecified atrial fibrillation: Secondary | ICD-10-CM | POA: Diagnosis not present

## 2019-12-31 NOTE — Progress Notes (Signed)
Debroah Snow Date of Birth: 13-Nov-1945 Medical Record [id number]  History of Present Illness: [name] is seen for  followup Afib. She has a history of paroxysmal atrial fibrillation, hypertension, and hypercholesterolemia. She also has a family history of early coronary disease. She has had an elevated CRP level in the past. She wore a Holter monitor in June 2018 which showed Afib with suboptimal HR control. Cardizem CD 120 mg daily was added.   On  followup today she states she is doing very well from a cardiac standpoint. She has rare palpitations that occur when she is stressed.  BP has been well controlled. No chest pain, dyspnea. No edema. She continues to be active with  walking daily.   Current Outpatient Medications on File Prior to Visit  Medication Sig Dispense Refill  . benazepril (LOTENSIN) 5 MG tablet TAKE 1 TABLET BY MOUTH EVERY DAY 90 tablet 0  . Cholecalciferol (VITAMIN D) 2000 UNITS CAPS Take by mouth daily.      [name] diltiazem (CARDIZEM) 120 MG tablet TAKE 1 TABLET BY MOUTH EVERY DAY 90 tablet 3  . fish oil-omega-3 fatty acids 1000 MG capsule Take 1 g by mouth daily. Taking 1200 daily     . Multiple Vitamin (MULTIVITAMIN) capsule Take 1 capsule by mouth daily.      . vitamin C (ASCORBIC ACID) 500 MG tablet Take 500 mg by mouth daily.      [name] 20 MG TABS tablet TAKE 1 TABLET (20 MG TOTAL) BY MOUTH DAILY WITH SUPPER. 90 tablet 1   No current facility-administered medications on file prior to visit.    Allergies  Allergen Reactions  . Metoprolol Other (See Comments)    hairloss    Past Medical History:  Diagnosis Date  . Elevated C-reactive protein (CRP)   . Hyperlipidemia   . Hypertension   . PAF (paroxysmal atrial fibrillation) (HCC)    hx of,echo 06/09/2010 normal LV function and normal atrial size    No past surgical history on file.  Social History   Tobacco Use  Smoking Status Never Smoker  Smokeless Tobacco Never Used    Social  History   Substance and Sexual Activity  Alcohol Use No  . Alcohol/week: 0.0 standard drinks    Family History  Problem Relation Age of Onset  . CVA Mother   . Heart attack Mother   . Hypertension Mother   . Heart attack Father   . Heart attack Brother     Review of Systems: As noted in HPI. All other systems were reviewed and are negative.  Physical Exam: BP 130/82   Pulse 71   Temp (!) 97.2 F (36.2 C)   Ht 5\' 6"  (1.676 m)   Wt 140 lb (63.5 kg)   SpO2 98%   BMI 22.60 kg/m  GENERAL:  Well appearing WF in NAD HEENT:  PERRL, EOMI, sclera are clear. Oropharynx is clear. NECK:  No jugular venous distention, carotid upstroke brisk and symmetric, no bruits, no thyromegaly or adenopathy LUNGS:  Clear to auscultation bilaterally CHEST:  Unremarkable HEART:  IRRR,  PMI not displaced or sustained,S1 and S2 within normal limits, no S3, no S4: no clicks, no rubs, no murmurs ABD:  Soft, nontender. BS +, no masses or bruits. No hepatomegaly, no splenomegaly EXT:  2 + pulses throughout, no edema, no cyanosis no clubbing SKIN:  Warm and dry.  No rashes NEURO:  Alert and oriented x 3. Cranial nerves II through XII intact. PSYCH:  Cognitively intact   LABORATORY DATA: Labs reviewed from 09/04/16: cholesterol 208, triglycerides 79, HDL 71, LDL 121. CMET normal. Dated 10/03/17: cholesterol 185, triglycerides 69. HDL 70, LDL 101. Chemistries normal. Dated 11/13/18: cholesterol 199, triglycerides 62, HDL 75, LDL 111. CMET normal. Dated 12/22/19: cholesterol 196, triglycerides 67, HDL 71, LDL 112. CMET, TSH, Hgb normal   Assessment / Plan: 1. HTN- well controlled   2. Atrial fibrillation. now premanent. She is  asymptomatic.  HR control is good  on Cardizem CD.   Her CHAD-Vasc score is 3. Continue Xarelto.   3. Hyperlipidemia.  LDL is mildly elevated. Continue dietary modification.

## 2020-01-05 ENCOUNTER — Other Ambulatory Visit: Payer: Self-pay

## 2020-01-05 ENCOUNTER — Ambulatory Visit: Payer: Medicare HMO | Admitting: Cardiology

## 2020-01-05 ENCOUNTER — Encounter: Payer: Self-pay | Admitting: Cardiology

## 2020-01-05 VITALS — BP 130/82 | HR 71 | Temp 97.2°F | Ht 66.0 in | Wt 140.0 lb

## 2020-01-05 DIAGNOSIS — I4821 Permanent atrial fibrillation: Secondary | ICD-10-CM | POA: Diagnosis not present

## 2020-01-05 DIAGNOSIS — E7800 Pure hypercholesterolemia, unspecified: Secondary | ICD-10-CM | POA: Diagnosis not present

## 2020-01-05 DIAGNOSIS — I10 Essential (primary) hypertension: Secondary | ICD-10-CM

## 2020-01-20 ENCOUNTER — Ambulatory Visit: Payer: Medicare Other | Attending: Internal Medicine

## 2020-01-20 DIAGNOSIS — Z23 Encounter for immunization: Secondary | ICD-10-CM | POA: Insufficient documentation

## 2020-01-20 NOTE — Progress Notes (Signed)
   Covid-19 Vaccination Clinic  Name:  Linda Snow    MRN: [id number] DOB: December 24, 1945  01/20/2020  Linda Snow was observed post Covid-19 immunization for 15 minutes without incidence. She was provided with Vaccine Information Sheet and instruction to access the V-Safe system.   Linda Snow was instructed to call 911 with any severe reactions post vaccine: [name] Difficulty breathing  . Swelling of your face and throat  . A fast heartbeat  . A bad rash all over your body  . Dizziness and weakness    Immunizations Administered    Name Date Dose VIS Date Route   Pfizer COVID-19 Vaccine 01/20/2020  2:20 PM 0.3 mL 12/11/2019 Intramuscular   Manufacturer: [place]   Lot: [id number]   [place]: [id number]

## 2020-01-26 IMAGING — MR MRI HEAD WITHOUT AND WITH CONTRAST
11 of 12 series · 38 of 48 positions shown · IV contrast (multihance)
Comparison: None.

CLINICAL DATA: Asymmetric hearing loss

Creatinine was obtained on site at [HOSPITAL] at [HOSPITAL].
Results: Creatinine 0.7 mg/dL.
EXAM:
MRI HEAD WITHOUT AND WITH CONTRAST
TECHNIQUE: Multiplanar, multiecho pulse sequences of the brain and surrounding
structures were obtained without and with intravenous contrast.
CONTRAST:  10mL MULTIHANCE GADOBENATE DIMEGLUMINE 529 MG/ML IV SOLN

[Series 2: T1 · sagittal · 5.0mm · 0.45mm/px · 3 of 21 slices shown (1 of 4)]
[im 1/21]
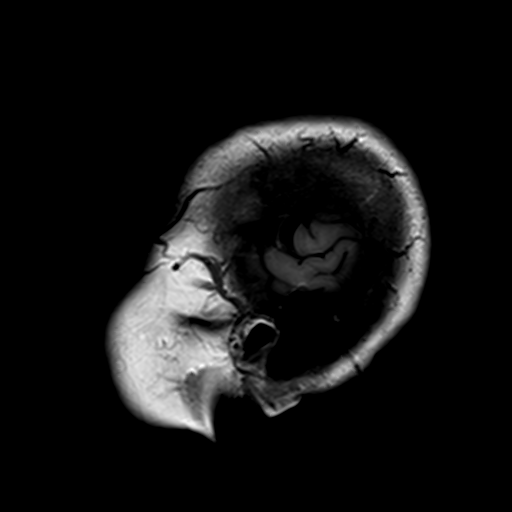
[im 11/21]
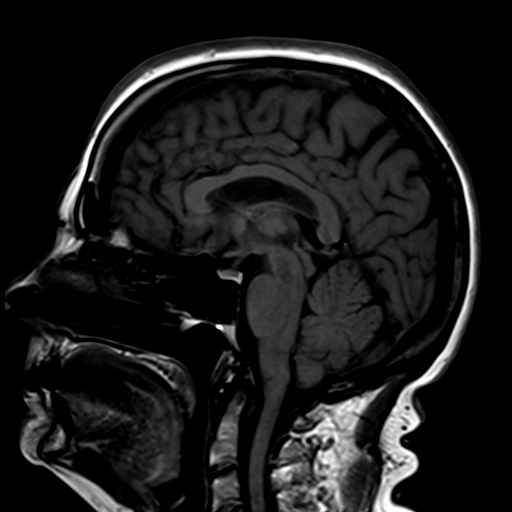
[im 21/21]
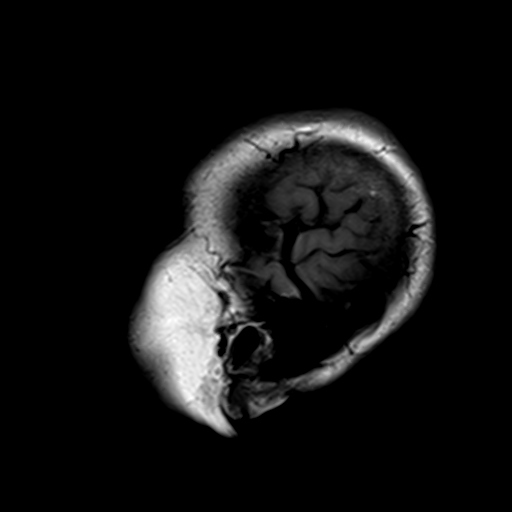

[Series 3: ep2d_diff_(id)_trace · axial · 3.0mm · 1.80mm/px · z∈[-41,+104]mm · 8 of 99 slices shown]
[im 1/99]
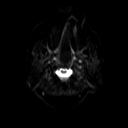
[im 11/99]
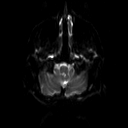
[im 33/99]
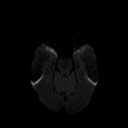
[im 44/99]
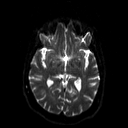
[im 55/99]
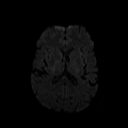
[im 66/99]
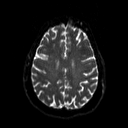
[im 88/99]
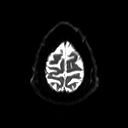
[im 99/99]
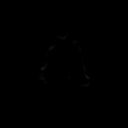

[Series 4: ep2d_diff_(id)_trace_adc · axial · 3.0mm · 1.80mm/px · z∈[-41,+104]mm · 5 of 50 slices shown]
[im 1/50]
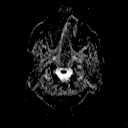
[im 13/50]
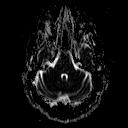
[im 25/50]
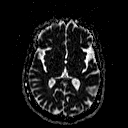
[im 37/50]
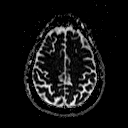
[im 50/50]
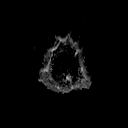

[Series 5: t2_tse_tra_512 · axial · 5.0mm · 0.72mm/px · z∈[-42,+105]mm · 3 of 25 slices shown]
[im 1/25]
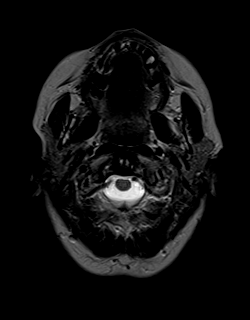
[im 13/25]
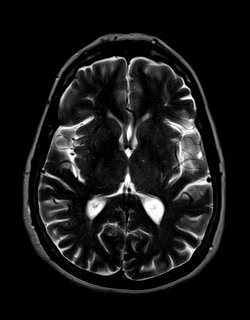
[im 25/25]
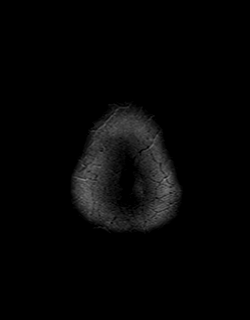

[Series 7: swi_images · axial · 2.0mm · 0.90mm/px · z∈[-46,+109]mm · 8 of 80 slices shown]
[im 1/80]
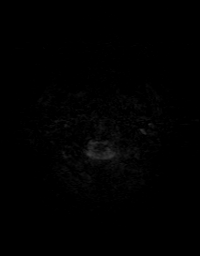
[im 12/80]
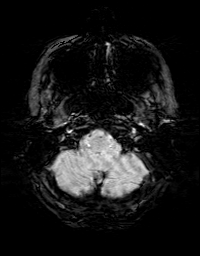
[im 23/80]
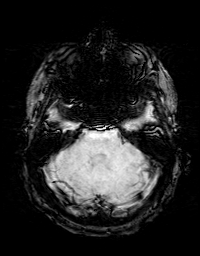
[im 34/80]
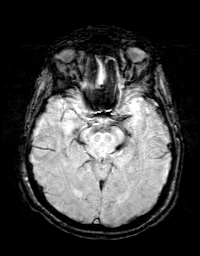
[im 46/80]
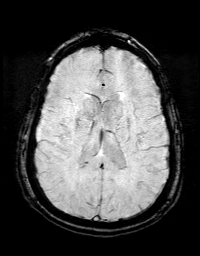
[im 57/80]
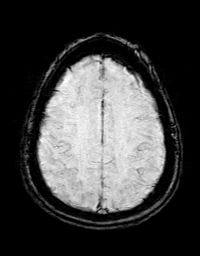
[im 68/80]
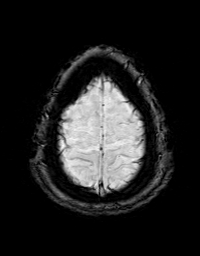
[im 80/80]
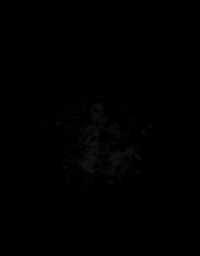

[Series 8: FLAIR · axial · 3.0mm · 0.43mm/px · z∈[-46,+107]mm · 3 of 27 slices shown]
[im 1/27]
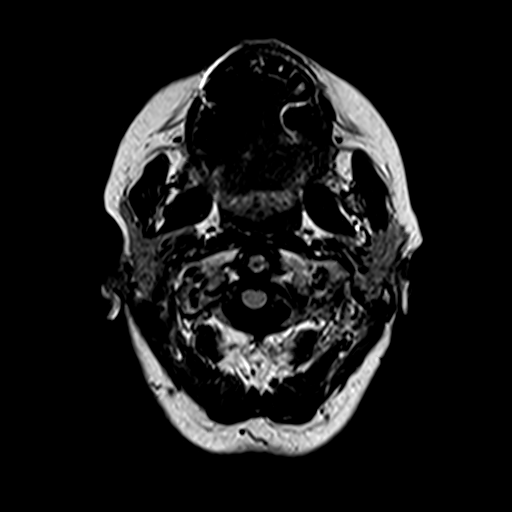
[im 14/27]
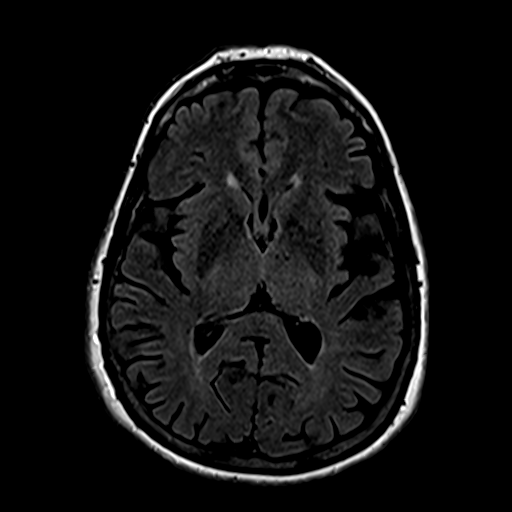
[im 27/27]
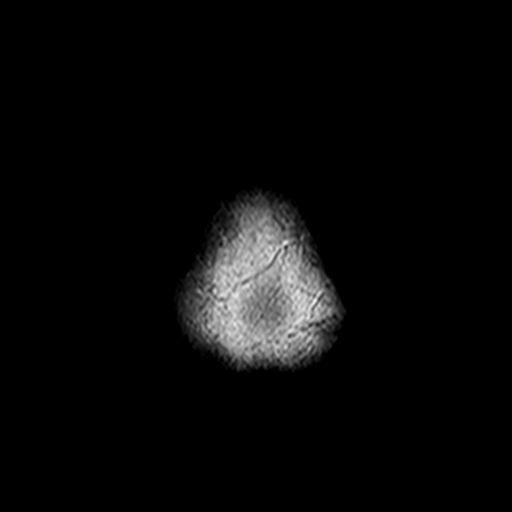

[Series 9: T1 · coronal · 3.0mm · 0.35mm/px · 1 of 11 slices shown (2 of 4)]
[im 1/11]
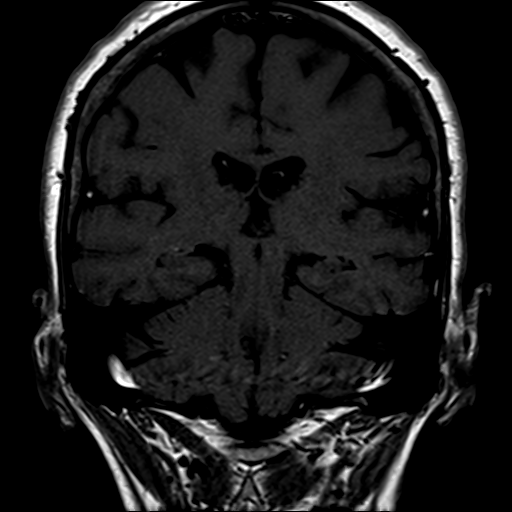

[Series 10: T1 · axial · 3.0mm · 0.35mm/px · 1 of 11 slices shown (3 of 4)]
[im 1/11]
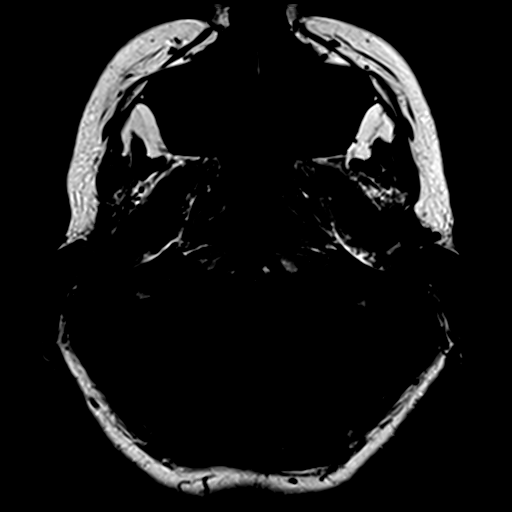

[Series 11: bSSFP · axial · 0.7mm · 0.28mm/px · z∈[-27,+2]mm · 4 of 44 slices shown]
[im 1/44]
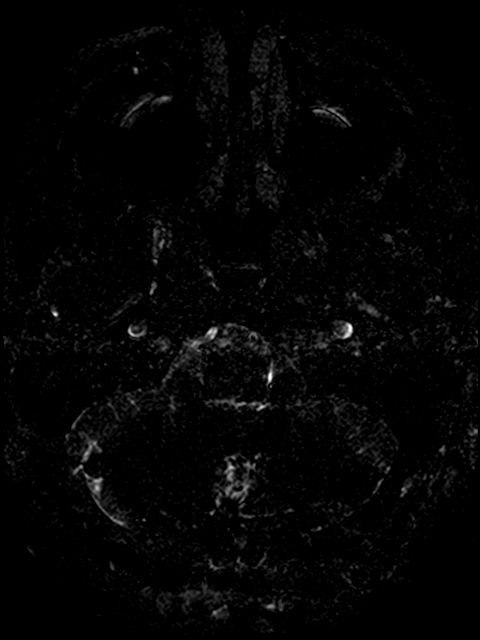
[im 15/44]
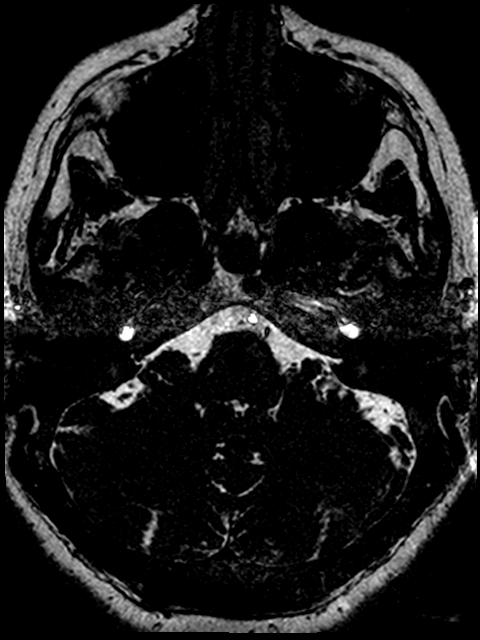
[im 29/44]
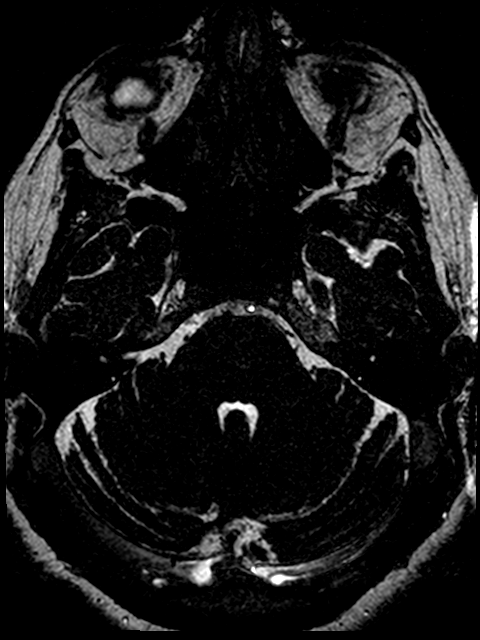
[im 44/44]
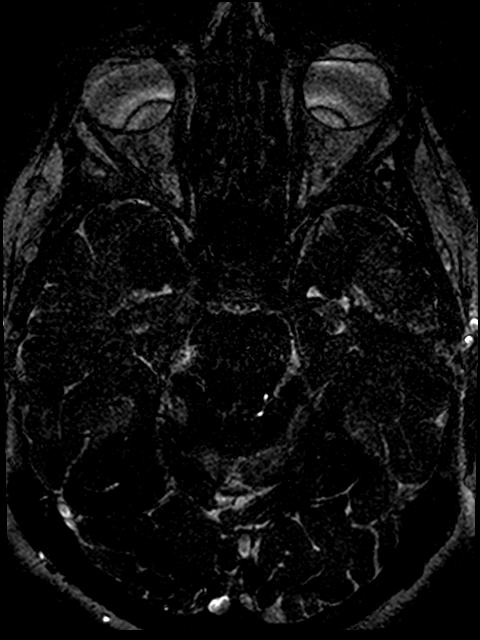

[Series 12: T1 · coronal · 3.0mm · 0.35mm/px · 1 of 11 slices shown (4 of 4)]
[im 1/11]
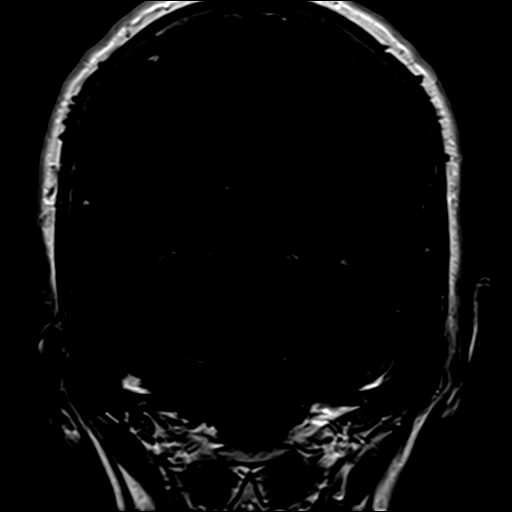

[Series 13: T1 post-contrast · axial · 3.0mm · 0.35mm/px · 1 of 11 slices shown]
[im 1/11]
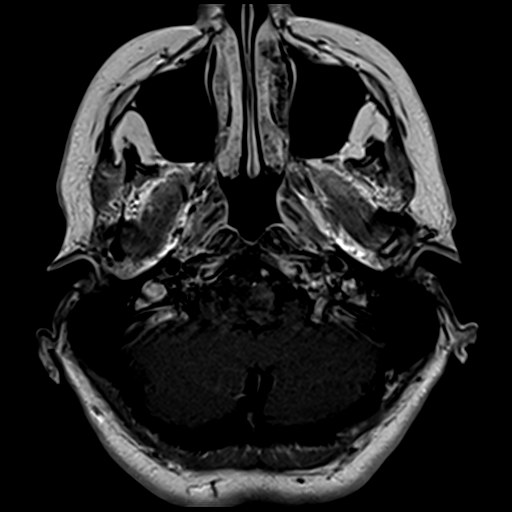

[38 of 48 positions shown; findings below may reference images not displayed]

FINDINGS: Brain: IAC protocol. Seventh and eighth cranial nerves normal.
Negative for vestibular schwannoma. Brainstem and cerebellum normal.
Cerebellar pontine angle cistern normal bilaterally. Mastoid sinus
clear bilaterally.

Ventricle size normal. Negative for acute infarct. Minimal chronic
white matter changes. Negative for hemorrhage or mass. Normal
enhancement of the brain.

Vascular: Normal arterial flow voids

Skull and upper cervical spine: Negative

Sinuses/Orbits: Paranasal sinuses clear.  Bilateral cataract surgery

Other: None
IMPRESSION: No cause for hearing loss identified. No acute intracranial
abnormality.

## 2020-01-30 ENCOUNTER — Ambulatory Visit: Payer: Medicare HMO

## 2020-02-02 DIAGNOSIS — R69 Illness, unspecified: Secondary | ICD-10-CM | POA: Diagnosis not present

## 2020-02-10 ENCOUNTER — Ambulatory Visit: Payer: Medicare HMO | Attending: Internal Medicine

## 2020-02-10 DIAGNOSIS — Z23 Encounter for immunization: Secondary | ICD-10-CM | POA: Insufficient documentation

## 2020-02-10 NOTE — Progress Notes (Signed)
   Covid-19 Vaccination Clinic  Name:  Linda Snow    MRN: [id number] DOB: 1945-05-09  02/10/2020  Linda Snow was observed post Covid-19 immunization for 15 minutes without incidence. She was provided with Vaccine Information Sheet and instruction to access the V-Safe system.   Linda Snow was instructed to call 911 with any severe reactions post vaccine: [name] Difficulty breathing  . Swelling of your face and throat  . A fast heartbeat  . A bad rash all over your body  . Dizziness and weakness    Immunizations Administered    Name Date Dose VIS Date Route   Pfizer COVID-19 Vaccine 02/10/2020  1:55 PM 0.3 mL 12/11/2019 Intramuscular   Manufacturer: [place]   Lot: [id number]   [place]: [id number]

## 2020-02-24 DIAGNOSIS — Z961 Presence of intraocular lens: Secondary | ICD-10-CM | POA: Diagnosis not present

## 2020-03-01 DIAGNOSIS — R69 Illness, unspecified: Secondary | ICD-10-CM | POA: Diagnosis not present

## 2020-03-14 ENCOUNTER — Other Ambulatory Visit: Payer: Self-pay | Admitting: Cardiology

## 2020-03-22 DIAGNOSIS — R69 Illness, unspecified: Secondary | ICD-10-CM | POA: Diagnosis not present

## 2020-04-18 ENCOUNTER — Ambulatory Visit (INDEPENDENT_AMBULATORY_CARE_PROVIDER_SITE_OTHER): Payer: Medicare HMO | Admitting: Ophthalmology

## 2020-04-18 ENCOUNTER — Encounter (INDEPENDENT_AMBULATORY_CARE_PROVIDER_SITE_OTHER): Payer: Self-pay | Admitting: Ophthalmology

## 2020-04-18 ENCOUNTER — Other Ambulatory Visit: Payer: Self-pay

## 2020-04-18 DIAGNOSIS — H35342 Macular cyst, hole, or pseudohole, left eye: Secondary | ICD-10-CM | POA: Insufficient documentation

## 2020-04-18 DIAGNOSIS — H353131 Nonexudative age-related macular degeneration, bilateral, early dry stage: Secondary | ICD-10-CM | POA: Insufficient documentation

## 2020-04-18 DIAGNOSIS — Z9889 Other specified postprocedural states: Secondary | ICD-10-CM | POA: Diagnosis not present

## 2020-04-18 DIAGNOSIS — H35352 Cystoid macular degeneration, left eye: Secondary | ICD-10-CM | POA: Insufficient documentation

## 2020-04-18 DIAGNOSIS — H43811 Vitreous degeneration, right eye: Secondary | ICD-10-CM | POA: Diagnosis not present

## 2020-04-18 DIAGNOSIS — Z09 Encounter for follow-up examination after completed treatment for conditions other than malignant neoplasm: Secondary | ICD-10-CM | POA: Insufficient documentation

## 2020-04-18 NOTE — Progress Notes (Signed)
04/18/2020     CHIEF COMPLAINT Patient presents for Retina Follow Up   HISTORY OF PRESENT ILLNESS: Linda Snow is a 75 y.o. female who presents to the clinic today for:   HPI    Retina Follow Up    Patient presents with  Other.  In both eyes.  Duration of 1 year.  Since onset it is stable.          Comments    1 year follow up - OCT OU Patient denies change in vision and overall has no complaints.        Last edited by [name] on 04/18/2020 10:21 AM. (History)      Referring physician: [name], MD [place],  [place] [address]  HISTORICAL INFORMATION:   Selected notes from the [facility]: No current outpatient medications on file. (Ophthalmic Drugs)   No current facility-administered medications for this visit. (Ophthalmic Drugs)   Current Outpatient Medications (Other)  Medication Sig  . benazepril (LOTENSIN) 5 MG tablet TAKE 1 TABLET BY MOUTH EVERY DAY  . Cholecalciferol (VITAMIN D) 2000 UNITS CAPS Take by mouth daily.    [name] diltiazem (CARDIZEM) 120 MG tablet TAKE 1 TABLET BY MOUTH EVERY DAY  . fish oil-omega-3 fatty acids 1000 MG capsule Take 1 g by mouth daily. Taking 1200 daily   . Multiple Vitamin (MULTIVITAMIN) capsule Take 1 capsule by mouth daily.    . vitamin C (ASCORBIC ACID) 500 MG tablet Take 500 mg by mouth daily.    [name] 20 MG TABS tablet TAKE 1 TABLET (20 MG TOTAL) BY MOUTH DAILY WITH SUPPER.   No current facility-administered medications for this visit. (Other)      REVIEW OF SYSTEMS:    ALLERGIES Allergies  Allergen Reactions  . Metoprolol Other (See Comments)    hairloss    PAST MEDICAL HISTORY Past Medical History:  Diagnosis Date  . Elevated C-reactive protein (CRP)   . Hyperlipidemia   . Hypertension   . PAF (paroxysmal atrial fibrillation) (HCC)    hx of,echo 06/09/2010 normal LV function and normal atrial size   History reviewed. No  pertinent surgical history.  FAMILY HISTORY Family History  Problem Relation Age of Onset  . CVA Mother   . Heart attack Mother   . Hypertension Mother   . Heart attack Father   . Heart attack Brother     SOCIAL HISTORY Social History   Tobacco Use  . Smoking status: Never Smoker  . Smokeless tobacco: Never Used  Substance Use Topics  . Alcohol use: No    Alcohol/week: 0.0 standard drinks  . Drug use: No         OPHTHALMIC EXAM: Base Eye Exam    Visual Acuity (Snellen - Linear)      Right Left   Dist Cunningham 20/20-2 20/20       Tonometry (Tonopen, 10:27 AM)      Right Left   Pressure 21 19       Pupils      Pupils Dark Light Shape React APD   Right PERRL 4 2 Round Brisk None   Left PERRL 4 2 Round Brisk None       Visual Fields (Counting fingers)      Left Right    Full Full       Extraocular Movement      Right Left    Full  Full       Neuro/Psych    Oriented x3: Yes   Mood/Affect: Normal       Dilation    Both eyes: 1.0% Mydriacyl, 2.5% Phenylephrine @ 10:27 AM        Slit Lamp and Fundus Exam    External Exam      Right Left   External Normal Normal       Slit Lamp Exam      Right Left   Lids/Lashes Normal Normal   Conjunctiva/Sclera White and quiet White and quiet   Cornea Clear Clear   Anterior Chamber Deep and quiet Deep and quiet   Iris Round and reactive Round and reactive   Lens Posterior chamber intraocular lens Posterior chamber intraocular lens   Vitreous Normal Normal          IMAGING AND PROCEDURES  Imaging and Procedures for @TODAY @           ASSESSMENT/PLAN:  No problem-specific Assessment & Plan notes found for this encounter.      ICD-10-CM   1. 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 Cystoid macular edema of left eye  H35.352     1.  2.  3.  Ophthalmic Meds Ordered this visit:  No orders of the defined types were placed in this  encounter.      No follow-ups on file.  There are no Patient Instructions on file for this visit.   Explained the diagnoses, plan, and follow up with the patient and they expressed understanding.  Patient expressed understanding of the importance of proper follow up care.   [name] M.D. Diseases & Surgery of the Retina and Vitreous Retina & Diabetic [place] @TODAY @     Abbreviations: M myopia (nearsighted); A astigmatism; H hyperopia (farsighted); P presbyopia; Mrx spectacle prescription;  CTL contact lenses; OD right eye; OS left eye; OU both eyes  XT exotropia; ET esotropia; PEK punctate epithelial keratitis; PEE punctate epithelial erosions; DES dry eye syndrome; MGD meibomian gland dysfunction; ATs artificial tears; PFAT's preservative free artificial tears; [place] nuclear sclerotic cataract; PSC posterior subcapsular cataract; ERM epi-retinal membrane; PVD posterior vitreous detachment; RD retinal detachment; DM diabetes mellitus; DR diabetic retinopathy; NPDR non-proliferative diabetic retinopathy; PDR proliferative diabetic retinopathy; CSME clinically significant macular edema; DME diabetic macular edema; dbh dot blot hemorrhages; CWS cotton wool spot; POAG primary open angle glaucoma; C/D cup-to-disc ratio; HVF humphrey visual field; GVF goldmann visual field; OCT optical coherence tomography; IOP intraocular pressure; BRVO Branch retinal vein occlusion; CRVO central retinal vein occlusion; CRAO central retinal artery occlusion; BRAO branch retinal artery occlusion; RT retinal tear; SB scleral buckle; PPV pars plana vitrectomy; VH Vitreous hemorrhage; PRP panretinal laser photocoagulation; IVK intravitreal kenalog; VMT vitreomacular traction; MH Macular hole;  NVD neovascularization of the disc; NVE neovascularization elsewhere; AREDS age related eye disease study; ARMD age related macular degeneration; POAG primary open angle glaucoma; EBMD epithelial/anterior basement membrane  dystrophy; ACIOL anterior chamber intraocular lens; IOL intraocular lens; PCIOL posterior chamber intraocular lens; Phaco/IOL phacoemulsification with intraocular lens placement; [place] photorefractive keratectomy; LASIK laser assisted in situ keratomileusis; HTN hypertension; DM diabetes mellitus; COPD chronic obstructive pulmonary disease

## 2020-04-18 NOTE — Assessment & Plan Note (Signed)

## 2020-04-18 NOTE — Assessment & Plan Note (Signed)

## 2020-05-13 ENCOUNTER — Other Ambulatory Visit: Payer: Self-pay | Admitting: Cardiology

## 2020-05-23 DIAGNOSIS — R69 Illness, unspecified: Secondary | ICD-10-CM | POA: Diagnosis not present

## 2020-06-01 ENCOUNTER — Other Ambulatory Visit: Payer: Self-pay

## 2020-06-01 MED ORDER — RIVAROXABAN 20 MG PO TABS: ORAL_TABLET | ORAL | 1 refills | Status: DC

## 2020-06-03 DIAGNOSIS — Z1231 Encounter for screening mammogram for malignant neoplasm of breast: Secondary | ICD-10-CM | POA: Diagnosis not present

## 2020-06-07 ENCOUNTER — Other Ambulatory Visit: Payer: Self-pay | Admitting: Cardiology

## 2020-06-07 NOTE — Telephone Encounter (Signed)
Rx request sent to pharmacy.  

## 2020-07-07 NOTE — Progress Notes (Signed)
Linda Snow Date of Birth: [birth year] Medical Record #[id number]  History of Present Illness: Linda Snow is seen for  followup Afib. She has a history of paroxysmal atrial fibrillation, hypertension, and hypercholesterolemia. She also has a family history of early coronary disease. She has had an elevated CRP level in the past. She wore a Holter monitor in June 2018 which showed Afib with suboptimal HR control. Cardizem CD 120 mg daily was added.   On  followup today she states she is doing very Linda from a cardiac standpoint. She denies any significant palpitations.   BP has been Linda controlled. No chest pain, dyspnea. No edema. She continues to be active with  walking daily.   Current Outpatient Medications on File Prior to Visit  Medication Sig Dispense Refill  . benazepril (LOTENSIN) 5 MG tablet TAKE 1 TABLET BY MOUTH EVERY DAY 90 tablet 1  . Cholecalciferol (VITAMIN D) 2000 UNITS CAPS Take by mouth daily.      [name] diltiazem (CARDIZEM) 120 MG tablet TAKE 1 TABLET BY MOUTH EVERY DAY 90 tablet 3  . fish oil-omega-3 fatty acids 1000 MG capsule Take 1 g by mouth daily. Taking 1200 daily     . Multiple Vitamin (MULTIVITAMIN) capsule Take 1 capsule by mouth daily.      . rivaroxaban (XARELTO) 20 MG TABS tablet TAKE 1 TABLET (20 MG TOTAL) BY MOUTH DAILY WITH SUPPER. 90 tablet 1  . vitamin C (ASCORBIC ACID) 500 MG tablet Take 500 mg by mouth daily.       No current facility-administered medications on file prior to visit.    Allergies  Allergen Reactions  . Metoprolol Other (See Comments)    hairloss    Past Medical History:  Diagnosis Date  . Elevated C-reactive protein (CRP)   . Hyperlipidemia   . Hypertension   . PAF (paroxysmal atrial fibrillation) (HCC)    hx of,echo 06/09/2010 normal LV function and normal atrial size    History reviewed. No pertinent surgical history.  Social History   Tobacco Use  Smoking Status Never Smoker  Smokeless Tobacco Never Used     Social History   Substance and Sexual Activity  Alcohol Use No  . Alcohol/week: 0.0 standard drinks    Family History  Problem Relation Age of Onset  . CVA Mother   . Heart attack Mother   . Hypertension Mother   . Heart attack Father   . Heart attack Brother     Review of Systems: As noted in HPI. All other systems were reviewed and are negative.  Physical Exam: BP 130/76   Pulse 72   Temp (!) 97.2 F (36.2 C)   Ht 5\' 6"  (1.676 m)   Wt 145 lb 12.8 oz (66.1 kg)   SpO2 97%   BMI 23.53 kg/m  GENERAL:  Linda Snow in NAD HEENT:  PERRL, EOMI, sclera are clear. Oropharynx is clear. NECK:  No jugular venous distention, carotid upstroke brisk and symmetric, no bruits, no thyromegaly or adenopathy LUNGS:  Clear to auscultation bilaterally CHEST:  Unremarkable HEART:  IRRR,  PMI not displaced or sustained,S1 and S2 within normal limits, no S3, no S4: no clicks, no rubs, no murmurs ABD:  Soft, nontender. BS +, no masses or bruits. No hepatomegaly, no splenomegaly EXT:  2 + pulses throughout, no edema, no cyanosis no clubbing SKIN:  Warm and dry.  No rashes NEURO:  Alert and oriented x 3. Cranial nerves II through XII intact. PSYCH:  Cognitively intact   LABORATORY DATA: Labs reviewed from 09/04/16: cholesterol 208, triglycerides 79, HDL 71, LDL 121. CMET normal. Dated 10/03/17: cholesterol 185, triglycerides 69. HDL 70, LDL 101. Chemistries normal. Dated 11/13/18: cholesterol 199, triglycerides 62, HDL 75, LDL 111. CMET normal. Dated 12/22/19: cholesterol 196, triglycerides 67, HDL 71, LDL 112. CMET, TSH, Hgb normal  Ecg today shows Afib with rate 72. Nonspecific ST-T wave abnormality. I have personally reviewed and interpreted this study.  Assessment / Plan:  1. HTN- Linda controlled   2. Atrial fibrillation.  premanent. She is  asymptomatic.  HR is Linda controlled on Cardizem CD.   Her CHAD-Vasc score is 3. Continue Xarelto.   3. Hyperlipidemia.  LDL is mildly  elevated. Continue dietary modification.  Follow up in 6 months.

## 2020-07-11 ENCOUNTER — Encounter: Payer: Self-pay | Admitting: Cardiology

## 2020-07-11 ENCOUNTER — Ambulatory Visit: Payer: Medicare HMO | Admitting: Cardiology

## 2020-07-11 ENCOUNTER — Other Ambulatory Visit: Payer: Self-pay

## 2020-07-11 VITALS — BP 130/76 | HR 72 | Temp 97.2°F | Ht 66.0 in | Wt 145.8 lb

## 2020-07-11 DIAGNOSIS — I4821 Permanent atrial fibrillation: Secondary | ICD-10-CM | POA: Diagnosis not present

## 2020-07-11 DIAGNOSIS — I10 Essential (primary) hypertension: Secondary | ICD-10-CM

## 2020-07-11 DIAGNOSIS — E7800 Pure hypercholesterolemia, unspecified: Secondary | ICD-10-CM

## 2020-09-12 DIAGNOSIS — R69 Illness, unspecified: Secondary | ICD-10-CM | POA: Diagnosis not present

## 2020-09-20 DIAGNOSIS — L814 Other melanin hyperpigmentation: Secondary | ICD-10-CM | POA: Diagnosis not present

## 2020-09-20 DIAGNOSIS — D1801 Hemangioma of skin and subcutaneous tissue: Secondary | ICD-10-CM | POA: Diagnosis not present

## 2020-09-20 DIAGNOSIS — L723 Sebaceous cyst: Secondary | ICD-10-CM | POA: Diagnosis not present

## 2020-09-20 DIAGNOSIS — L821 Other seborrheic keratosis: Secondary | ICD-10-CM | POA: Diagnosis not present

## 2020-09-23 DIAGNOSIS — R69 Illness, unspecified: Secondary | ICD-10-CM | POA: Diagnosis not present

## 2020-11-26 ENCOUNTER — Other Ambulatory Visit: Payer: Self-pay | Admitting: Cardiology

## 2020-11-28 DIAGNOSIS — R69 Illness, unspecified: Secondary | ICD-10-CM | POA: Diagnosis not present

## 2020-11-29 ENCOUNTER — Other Ambulatory Visit: Payer: Self-pay | Admitting: Cardiology

## 2020-12-13 DIAGNOSIS — Z0000 Encounter for general adult medical examination without abnormal findings: Secondary | ICD-10-CM | POA: Diagnosis not present

## 2020-12-13 DIAGNOSIS — Z79899 Other long term (current) drug therapy: Secondary | ICD-10-CM | POA: Diagnosis not present

## 2020-12-13 DIAGNOSIS — I10 Essential (primary) hypertension: Secondary | ICD-10-CM | POA: Diagnosis not present

## 2020-12-13 DIAGNOSIS — I4891 Unspecified atrial fibrillation: Secondary | ICD-10-CM | POA: Diagnosis not present

## 2020-12-13 DIAGNOSIS — E559 Vitamin D deficiency, unspecified: Secondary | ICD-10-CM | POA: Diagnosis not present

## 2020-12-13 DIAGNOSIS — E7800 Pure hypercholesterolemia, unspecified: Secondary | ICD-10-CM | POA: Diagnosis not present

## 2020-12-13 DIAGNOSIS — D6859 Other primary thrombophilia: Secondary | ICD-10-CM | POA: Diagnosis not present

## 2020-12-19 DIAGNOSIS — I4891 Unspecified atrial fibrillation: Secondary | ICD-10-CM | POA: Diagnosis not present

## 2020-12-19 DIAGNOSIS — E7800 Pure hypercholesterolemia, unspecified: Secondary | ICD-10-CM | POA: Diagnosis not present

## 2020-12-19 DIAGNOSIS — E559 Vitamin D deficiency, unspecified: Secondary | ICD-10-CM | POA: Diagnosis not present

## 2020-12-19 DIAGNOSIS — D6869 Other thrombophilia: Secondary | ICD-10-CM | POA: Diagnosis not present

## 2020-12-19 DIAGNOSIS — M8580 Other specified disorders of bone density and structure, unspecified site: Secondary | ICD-10-CM | POA: Diagnosis not present

## 2020-12-19 DIAGNOSIS — I10 Essential (primary) hypertension: Secondary | ICD-10-CM | POA: Diagnosis not present

## 2020-12-19 DIAGNOSIS — Z0000 Encounter for general adult medical examination without abnormal findings: Secondary | ICD-10-CM | POA: Diagnosis not present

## 2021-01-03 NOTE — Progress Notes (Signed)
Linda Snow Date of Birth: 07/11/1945 Medical Record #[id number]  History of Present Illness: Linda Snow is seen for  followup Afib. She has a history of paroxysmal atrial fibrillation, hypertension, and hypercholesterolemia. She also has a family history of early coronary disease. She has had an elevated CRP level in the past. She wore a Holter monitor in June 2018 which showed Afib with suboptimal HR control. Cardizem CD 120 mg daily was added.   On  followup today she states she is doing very well from a cardiac standpoint. She denies any significant palpitations. Hasn't had to use any extra Cardizem.   BP has been well controlled. No chest pain, dyspnea. No edema. She continues to be active with walking daily.   Current Outpatient Medications on File Prior to Visit  Medication Sig Dispense Refill  . benazepril (LOTENSIN) 5 MG tablet TAKE 1 TABLET BY MOUTH EVERY DAY 90 tablet 3  . Cholecalciferol (VITAMIN D) 2000 UNITS CAPS Take by mouth daily.    [name] diltiazem (CARDIZEM) 120 MG tablet TAKE 1 TABLET BY MOUTH EVERY DAY 90 tablet 3  . fish oil-omega-3 fatty acids 1000 MG capsule Take 1 g by mouth daily. Taking 1200 daily    . Multiple Vitamin (MULTIVITAMIN) capsule Take 1 capsule by mouth daily.    . vitamin C (ASCORBIC ACID) 500 MG tablet Take 500 mg by mouth daily.    Linda Snow 20 MG TABS tablet TAKE 1 TABLET (20 MG TOTAL) BY MOUTH DAILY WITH SUPPER. 90 tablet 1   No current facility-administered medications on file prior to visit.    Allergies  Allergen Reactions  . Metoprolol Other (See Comments)    hairloss    Past Medical History:  Diagnosis Date  . Elevated C-reactive protein (CRP)   . Hyperlipidemia   . Hypertension   . PAF (paroxysmal atrial fibrillation) (HCC)    hx of,echo 06/09/2010 normal LV function and normal atrial size    History reviewed. No pertinent surgical history.  Social History   Tobacco Use  Smoking Status Never Smoker  Smokeless Tobacco  Never Used    Social History   Substance and Sexual Activity  Alcohol Use No  . Alcohol/week: 0.0 standard drinks    Family History  Problem Relation Age of Onset  . CVA Mother   . Heart attack Mother   . Hypertension Mother   . Heart attack Father   . Heart attack Brother     Review of Systems: As noted in HPI. All other systems were reviewed and are negative.  Physical Exam: BP 130/82   Pulse 80   Ht 5\' 6"  (1.676 m)   Wt 145 lb 6.4 oz (66 kg)   SpO2 99%   BMI 23.47 kg/m  GENERAL:  Well appearing WF in NAD HEENT:  PERRL, EOMI, sclera are clear. Oropharynx is clear. NECK:  No jugular venous distention, carotid upstroke brisk and symmetric, no bruits, no thyromegaly or adenopathy LUNGS:  Clear to auscultation bilaterally CHEST:  Unremarkable HEART:  IRRR,  PMI not displaced or sustained,S1 and S2 within normal limits, no S3, no S4: no clicks, no rubs, no murmurs ABD:  Soft, nontender. BS +, no masses or bruits. No hepatomegaly, no splenomegaly EXT:  2 + pulses throughout, no edema, no cyanosis no clubbing SKIN:  Warm and dry.  No rashes NEURO:  Alert and oriented x 3. Cranial nerves II through XII intact. PSYCH:  Cognitively intact   LABORATORY DATA: Labs reviewed from 09/04/16:  cholesterol 208, triglycerides 79, HDL 71, LDL 121. CMET normal. Dated 10/03/17: cholesterol 185, triglycerides 69. HDL 70, LDL 101. Chemistries normal. Dated 11/13/18: cholesterol 199, triglycerides 62, HDL 75, LDL 111. CMET normal. Dated 12/22/19: cholesterol 196, triglycerides 67, HDL 71, LDL 112. CMET, TSH, Hgb normal Dated 12/13/20: cholesterol 188, triglycerides 47, HDL 66, LDL 112. CMET and CBC normal.  Assessment / Plan:  1. HTN- well controlled   2. Atrial fibrillation.  premanent. She is  asymptomatic.  HR is well controlled on Cardizem CD.   Her CHAD-Vasc score is 3. Continue Xarelto.   3. Hyperlipidemia.  LDL is mildly elevated. Continue dietary modification.  Follow up in 6  months.

## 2021-01-06 ENCOUNTER — Other Ambulatory Visit: Payer: Self-pay

## 2021-01-06 ENCOUNTER — Encounter: Payer: Self-pay | Admitting: Cardiology

## 2021-01-06 ENCOUNTER — Ambulatory Visit (INDEPENDENT_AMBULATORY_CARE_PROVIDER_SITE_OTHER): Payer: Medicare HMO | Admitting: Cardiology

## 2021-01-06 VITALS — BP 130/82 | HR 80 | Ht 66.0 in | Wt 145.4 lb

## 2021-01-06 DIAGNOSIS — E7800 Pure hypercholesterolemia, unspecified: Secondary | ICD-10-CM

## 2021-01-06 DIAGNOSIS — I4821 Permanent atrial fibrillation: Secondary | ICD-10-CM | POA: Diagnosis not present

## 2021-01-06 DIAGNOSIS — I10 Essential (primary) hypertension: Secondary | ICD-10-CM

## 2021-02-28 DIAGNOSIS — H47022 Hemorrhage in optic nerve sheath, left eye: Secondary | ICD-10-CM | POA: Diagnosis not present

## 2021-02-28 DIAGNOSIS — Z961 Presence of intraocular lens: Secondary | ICD-10-CM | POA: Diagnosis not present

## 2021-04-18 ENCOUNTER — Other Ambulatory Visit: Payer: Self-pay

## 2021-04-18 ENCOUNTER — Ambulatory Visit (INDEPENDENT_AMBULATORY_CARE_PROVIDER_SITE_OTHER): Payer: Medicare HMO | Admitting: Ophthalmology

## 2021-04-18 ENCOUNTER — Encounter (INDEPENDENT_AMBULATORY_CARE_PROVIDER_SITE_OTHER): Payer: Self-pay | Admitting: Ophthalmology

## 2021-04-18 DIAGNOSIS — H35352 Cystoid macular degeneration, left eye: Secondary | ICD-10-CM

## 2021-04-18 DIAGNOSIS — H353131 Nonexudative age-related macular degeneration, bilateral, early dry stage: Secondary | ICD-10-CM

## 2021-04-18 DIAGNOSIS — H43811 Vitreous degeneration, right eye: Secondary | ICD-10-CM | POA: Diagnosis not present

## 2021-04-18 DIAGNOSIS — H35342 Macular cyst, hole, or pseudohole, left eye: Secondary | ICD-10-CM | POA: Diagnosis not present

## 2021-04-18 NOTE — Progress Notes (Signed)
04/18/2021     CHIEF COMPLAINT Patient presents for Retina Follow Up (1 Year F/U OU//Pt denies noticeable changes to [place] OU since last visit. Pt denies ocular pain, flashes of light, or floaters OU. //)   HISTORY OF PRESENT ILLNESS: Linda Snow is a 76 y.o. female who presents to the clinic today for:   HPI    Retina Follow Up    Patient presents with  Other.  In left eye.  This started 1 year ago.  Severity is mild.  Duration of 1 year.  Since onset it is stable. Additional comments: 1 Year F/U OU  Pt denies noticeable changes to [place] OU since last visit. Pt denies ocular pain, flashes of light, or floaters OU.          Last edited by Rockie Neighbours, [place] on 04/18/2021 10:02 AM. (History)      Referring physician: [name], MD [place],  [place] [address]  HISTORICAL INFORMATION:   Selected notes from the [facility]: No current outpatient medications on file. (Ophthalmic Drugs)   No current facility-administered medications for this visit. (Ophthalmic Drugs)   Current Outpatient Medications (Other)  Medication Sig  . benazepril (LOTENSIN) 5 MG tablet TAKE 1 TABLET BY MOUTH EVERY DAY  . Cholecalciferol (VITAMIN D) 2000 UNITS CAPS Take by mouth daily.  [name] diltiazem (CARDIZEM) 120 MG tablet TAKE 1 TABLET BY MOUTH EVERY DAY  . fish oil-omega-3 fatty acids 1000 MG capsule Take 1 g by mouth daily. Taking 1200 daily  . Multiple Vitamin (MULTIVITAMIN) capsule Take 1 capsule by mouth daily.  . vitamin C (ASCORBIC ACID) 500 MG tablet Take 500 mg by mouth daily.  [name] 20 MG TABS tablet TAKE 1 TABLET (20 MG TOTAL) BY MOUTH DAILY WITH SUPPER.   No current facility-administered medications for this visit. (Other)      REVIEW OF SYSTEMS:    ALLERGIES Allergies  Allergen Reactions  . Metoprolol Other (See Comments)    hairloss    PAST MEDICAL HISTORY Past Medical History:  Diagnosis Date  .  Elevated C-reactive protein (CRP)   . Hyperlipidemia   . Hypertension   . PAF (paroxysmal atrial fibrillation) (HCC)    hx of,echo 06/09/2010 normal LV function and normal atrial size   History reviewed. No pertinent surgical history.  FAMILY HISTORY Family History  Problem Relation Age of Onset  . CVA Mother   . Heart attack Mother   . Hypertension Mother   . Heart attack Father   . Heart attack Brother     SOCIAL HISTORY Social History   Tobacco Use  . Smoking status: Never Smoker  . Smokeless tobacco: Never Used  Vaping Use  . Vaping Use: Never used  Substance Use Topics  . Alcohol use: No    Alcohol/week: 0.0 standard drinks  . Drug use: No         OPHTHALMIC EXAM: Base Eye Exam    Visual Acuity (ETDRS)      Right Left   Dist [place] 20/20 -1 20/20 -2       Tonometry (Tonopen, 10:04 AM)      Right Left   Pressure 19 19       Pupils      Dark Light Shape React APD   Right 4 3 Round Brisk None   Left 3 2 Round Brisk None       Visual Fields (  Counting fingers)      Left Right    Full Full       Extraocular Movement      Right Left    Full Full       Neuro/Psych    Oriented x3: Yes   Mood/Affect: Normal       Dilation    Both eyes: 1.0% Mydriacyl, 2.5% Phenylephrine @ 10:04 AM        Slit Lamp and Fundus Exam    External Exam      Right Left   External Normal Normal       Slit Lamp Exam      Right Left   Lids/Lashes Normal Normal   Conjunctiva/Sclera White and quiet White and quiet   Cornea Clear Clear   Anterior Chamber Deep and quiet Deep and quiet   Iris Round and reactive Round and reactive   Lens Posterior chamber intraocular lens Posterior chamber intraocular lens   Anterior Vitreous Normal Normal       Fundus Exam      Right Left   Posterior Vitreous Posterior vitreous detachment Normal,, clear vitrectomized   Disc Normal Normal   C/D Ratio 0.5 0.5   Macula Drusen, Hard drusen Drusen, Hard drusen, no hole   Vessels  Normal Normal   Periphery Normal Normal, no heme          IMAGING AND PROCEDURES  Imaging and Procedures for 04/18/21  OCT, Retina - OU - Both Eyes       Right Eye Quality was good. Scan locations included subfoveal. Central Foveal Thickness: 319. Progression has been stable.   Left Eye Quality was good. Scan locations included subfoveal. Central Foveal Thickness: 318. Progression has improved. Findings include normal observations, outer retinal atrophy.   Notes History of macular hole left eye and its closure March 2020, outer retina stable, hole closed  OD normal                ASSESSMENT/PLAN:  Early stage nonexudative age-related macular degeneration of both eyes The nature of age--related macular degeneration was discussed with the patient as well as the distinction between dry and wet types. Checking an Amsler Grid daily with advice to return immediately should a distortion develop, was given to the patient. The patient 's smoking status now and in the past was determined and advice based on the AREDS study was provided regarding the consumption of antioxidant supplements. AREDS 2 vitamin formulation was recommended. Consumption of dark leafy vegetables and fresh fruits of various colors was recommended. Treatment modalities for wet macular degeneration particularly the use of intravitreal injections of anti-blood vessel growth factors was discussed with the patient. Avastin, Lucentis, and Eylea are the available options. On occasion, therapy includes the use of photodynamic therapy and thermal laser. Stressed to the patient do not rub eyes.  Patient was advised to check Amsler Grid daily and return immediately if changes are noted. Instructions on using the grid were given to the patient. All patient questions were answered.  Cystoid macular edema of left eye OS resolved there is a component of macular hole and its closure  Posterior vitreous detachment of right  eye Cataract(s) account for the patient's complaint. I discussed the risks and benefits of cataract surgery. Options were explained to the patient. The patient understands that new glasses may not improve their vision and desires to have cataract surgery. I have recommended follow up with their general eye care doctor for evaluation and consideration of  cataract extraction with new intraocular lens insertion.   The nature of posterior vitreous detachment was discussed with the patient as well as its physiology, its age prevalence, and its possible implication regarding retinal breaks and detachment.  An informational brochure was given to the patient.  All the patient's questions were answered.  The patient was asked to return if new or different flashes or floaters develops.   Patient was instructed to contact office immediately if any changes were noticed. I explained to the patient that vitreous inside the eye is similar to jello inside a bowl. As the jello melts it can start to pull away from the bowl, similarly the vitreous throughout our lives can begin to pull away from the retina. That process is called a posterior vitreous detachment. In some cases, the vitreous can tug hard enough on the retina to form a retinal tear. I discussed with the patient the signs and symptoms of a retinal detachment.  Do not rub the eye.    Visual axis involve by dense cataract requires surgical intervention via cataract extraction with intraocular lens placement so as to allow for my medical monitoring of the macular condition as well as enhance and maximize visual potential for this function of this eye as this patient enters that year of life with reduced ambulatory capability      ICD-10-CM   1. Lamellar macular hole of left eye  H35.342 OCT, Retina - OU - Both Eyes  2. Early stage nonexudative age-related macular degeneration of both eyes  H35.3131   3. Macular hole of left eye  H35.342   4. Cystoid macular  edema of left eye  H35.352   5. Posterior vitreous detachment of right eye  H43.811     1.  No active findings OU  will monitor for ARMD going forward  2.  History of macular hole and successful closure now 2 years previous observe  3.  Ophthalmic Meds Ordered this visit:  No orders of the defined types were placed in this encounter.      Return in about 1 year (around 04/18/2022) for DILATE OU, OCT.  There are no Patient Instructions on file for this visit.   Explained the diagnoses, plan, and follow up with the patient and they expressed understanding.  Patient expressed understanding of the importance of proper follow up care.   Clent Demark Naphtali Zywicki M.D. Diseases & Surgery of the Retina and Vitreous [facility] [place] 04/18/21     Abbreviations: M myopia (nearsighted); A astigmatism; H hyperopia (farsighted); P presbyopia; Mrx spectacle prescription;  CTL contact lenses; OD right eye; OS left eye; OU both eyes  XT exotropia; ET esotropia; PEK punctate epithelial keratitis; PEE punctate epithelial erosions; DES dry eye syndrome; MGD meibomian gland dysfunction; ATs artificial tears; PFAT's preservative free artificial tears; Diggins nuclear sclerotic cataract; PSC posterior subcapsular cataract; ERM epi-retinal membrane; PVD posterior vitreous detachment; RD retinal detachment; DM diabetes mellitus; DR diabetic retinopathy; NPDR non-proliferative diabetic retinopathy; PDR proliferative diabetic retinopathy; CSME clinically significant macular edema; DME diabetic macular edema; dbh dot blot hemorrhages; CWS cotton wool spot; POAG primary open angle glaucoma; C/D cup-to-disc ratio; HVF humphrey visual field; GVF goldmann visual field; OCT optical coherence tomography; IOP intraocular pressure; BRVO Branch retinal vein occlusion; CRVO central retinal vein occlusion; CRAO central retinal artery occlusion; BRAO branch retinal artery occlusion; RT retinal tear; SB scleral buckle; PPV pars  plana vitrectomy; VH Vitreous hemorrhage; PRP panretinal laser photocoagulation; IVK intravitreal kenalog; VMT vitreomacular traction;  MH Macular hole;  NVD neovascularization of the disc; NVE neovascularization elsewhere; AREDS age related eye disease study; ARMD age related macular degeneration; POAG primary open angle glaucoma; EBMD epithelial/anterior basement membrane dystrophy; ACIOL anterior chamber intraocular lens; IOL intraocular lens; PCIOL posterior chamber intraocular lens; Phaco/IOL phacoemulsification with intraocular lens placement; Ralston photorefractive keratectomy; LASIK laser assisted in situ keratomileusis; HTN hypertension; DM diabetes mellitus; COPD chronic obstructive pulmonary disease

## 2021-04-18 NOTE — Assessment & Plan Note (Signed)
OS resolved there is a component of macular hole and its closure

## 2021-04-18 NOTE — Assessment & Plan Note (Signed)

## 2021-04-18 NOTE — Assessment & Plan Note (Signed)
Cataract(s) account for the patient's complaint. I discussed the risks and benefits of cataract surgery. Options were explained to the patient. The patient understands that new glasses may not improve their vision and desires to have cataract surgery. I have recommended follow up with their general eye care doctor for evaluation and consideration of cataract extraction with new intraocular lens insertion.   The nature of posterior vitreous detachment was discussed with the patient as well as its physiology, its age prevalence, and its possible implication regarding retinal breaks and detachment.  An informational brochure was given to the patient.  All the patient's questions were answered.  The patient was asked to return if new or different flashes or floaters develops.   Patient was instructed to contact office immediately if any changes were noticed. I explained to the patient that vitreous inside the eye is similar to jello inside a bowl. As the jello melts it can start to pull away from the bowl, similarly the vitreous throughout our lives can begin to pull away from the retina. That process is called a posterior vitreous detachment. In some cases, the vitreous can tug hard enough on the retina to form a retinal tear. I discussed with the patient the signs and symptoms of a retinal detachment.  Do not rub the eye.    Visual axis involve by dense cataract requires surgical intervention via cataract extraction with intraocular lens placement so as to allow for my medical monitoring of the macular condition as well as enhance and maximize visual potential for this function of this eye as this patient enters that year of life with reduced ambulatory capability

## 2021-04-27 DIAGNOSIS — M8580 Other specified disorders of bone density and structure, unspecified site: Secondary | ICD-10-CM | POA: Diagnosis not present

## 2021-04-27 DIAGNOSIS — I10 Essential (primary) hypertension: Secondary | ICD-10-CM | POA: Diagnosis not present

## 2021-04-27 DIAGNOSIS — D6869 Other thrombophilia: Secondary | ICD-10-CM | POA: Diagnosis not present

## 2021-04-27 DIAGNOSIS — Z8049 Family history of malignant neoplasm of other genital organs: Secondary | ICD-10-CM | POA: Diagnosis not present

## 2021-04-27 DIAGNOSIS — R32 Unspecified urinary incontinence: Secondary | ICD-10-CM | POA: Diagnosis not present

## 2021-04-27 DIAGNOSIS — E785 Hyperlipidemia, unspecified: Secondary | ICD-10-CM | POA: Diagnosis not present

## 2021-04-27 DIAGNOSIS — I4891 Unspecified atrial fibrillation: Secondary | ICD-10-CM | POA: Diagnosis not present

## 2021-04-27 DIAGNOSIS — Z8249 Family history of ischemic heart disease and other diseases of the circulatory system: Secondary | ICD-10-CM | POA: Diagnosis not present

## 2021-04-27 DIAGNOSIS — Z7901 Long term (current) use of anticoagulants: Secondary | ICD-10-CM | POA: Diagnosis not present

## 2021-04-27 DIAGNOSIS — Z803 Family history of malignant neoplasm of breast: Secondary | ICD-10-CM | POA: Diagnosis not present

## 2021-05-04 ENCOUNTER — Other Ambulatory Visit: Payer: Self-pay | Admitting: Cardiology

## 2021-05-16 DIAGNOSIS — Z803 Family history of malignant neoplasm of breast: Secondary | ICD-10-CM | POA: Diagnosis not present

## 2021-05-16 DIAGNOSIS — Z01419 Encounter for gynecological examination (general) (routine) without abnormal findings: Secondary | ICD-10-CM | POA: Diagnosis not present

## 2021-05-16 DIAGNOSIS — Z6824 Body mass index (BMI) 24.0-24.9, adult: Secondary | ICD-10-CM | POA: Diagnosis not present

## 2021-05-16 DIAGNOSIS — Z124 Encounter for screening for malignant neoplasm of cervix: Secondary | ICD-10-CM | POA: Diagnosis not present

## 2021-05-16 DIAGNOSIS — M8580 Other specified disorders of bone density and structure, unspecified site: Secondary | ICD-10-CM | POA: Diagnosis not present

## 2021-05-16 DIAGNOSIS — Z01411 Encounter for gynecological examination (general) (routine) with abnormal findings: Secondary | ICD-10-CM | POA: Diagnosis not present

## 2021-05-16 DIAGNOSIS — Z8049 Family history of malignant neoplasm of other genital organs: Secondary | ICD-10-CM | POA: Diagnosis not present

## 2021-06-09 DIAGNOSIS — Z1231 Encounter for screening mammogram for malignant neoplasm of breast: Secondary | ICD-10-CM | POA: Diagnosis not present

## 2021-06-09 DIAGNOSIS — Z780 Asymptomatic menopausal state: Secondary | ICD-10-CM | POA: Diagnosis not present

## 2021-06-09 DIAGNOSIS — M8589 Other specified disorders of bone density and structure, multiple sites: Secondary | ICD-10-CM | POA: Diagnosis not present

## 2021-06-19 ENCOUNTER — Other Ambulatory Visit: Payer: Self-pay | Admitting: Cardiology

## 2021-06-19 NOTE — Telephone Encounter (Signed)
Prescription refill request for Xarelto received.  Indication:atrial fib Last office visit:1/22 Weight:66 kg Age:76 Scr:0.6 CrCl:83.11 ml/min  Prescription refilled

## 2021-06-28 DIAGNOSIS — H40022 Open angle with borderline findings, high risk, left eye: Secondary | ICD-10-CM | POA: Diagnosis not present

## 2021-06-30 NOTE — Progress Notes (Signed)
Linda Snow Date of Birth: [birth year] Medical Record #[id number]  History of Present Illness: Linda Snow is seen for  followup Afib. She has a history of paroxysmal atrial fibrillation, hypertension, and hypercholesterolemia. She also has a family history of early coronary disease. She has had an elevated CRP level in the past. She wore a Holter monitor in June 2018 which showed Afib with suboptimal HR control. Cardizem CD 120 mg daily was added.   On  followup today she states she is doing very well from a cardiac standpoint. She denies any significant palpitations.   BP has been well controlled. No chest pain, dyspnea. No edema. She continues to be active with walking daily. She is really not aware of Afib.  Current Outpatient Medications on File Prior to Visit  Medication Sig Dispense Refill   benazepril (LOTENSIN) 5 MG tablet TAKE 1 TABLET BY MOUTH EVERY DAY 90 tablet 3   Cholecalciferol (VITAMIN D) 2000 UNITS CAPS Take by mouth daily.     diltiazem (CARDIZEM) 120 MG tablet TAKE 1 TABLET BY MOUTH EVERY DAY 90 tablet 3   Multiple Vitamin (MULTIVITAMIN) capsule Take 1 capsule by mouth daily.     vitamin C (ASCORBIC ACID) 500 MG tablet Take 500 mg by mouth daily.     XARELTO 20 MG TABS tablet TAKE 1 TABLET BY MOUTH DAILY WITH SUPPER. 90 tablet 1   No current facility-administered medications on file prior to visit.    Allergies  Allergen Reactions   Metoprolol Other (See Comments)    hairloss    Past Medical History:  Diagnosis Date   Elevated C-reactive protein (CRP)    Hyperlipidemia    Hypertension    PAF (paroxysmal atrial fibrillation) (HCC)    hx of,echo 06/09/2010 normal LV function and normal atrial size    History reviewed. No pertinent surgical history.  Social History   Tobacco Use  Smoking Status Never  Smokeless Tobacco Never    Social History   Substance and Sexual Activity  Alcohol Use No   Alcohol/week: 0.0 standard drinks    Family  History  Problem Relation Age of Onset   CVA Mother    Heart attack Mother    Hypertension Mother    Heart attack Father    Heart attack Brother     Review of Systems: As noted in HPI. All other systems were reviewed and are negative.  Physical Exam: BP 136/82   Pulse 88   Ht 5\' 6"  (1.676 m)   Wt 142 lb 3.2 oz (64.5 kg)   SpO2 96%   BMI 22.95 kg/m  GENERAL:  Well appearing WF in NAD HEENT:  PERRL, EOMI, sclera are clear. Oropharynx is clear. NECK:  No jugular venous distention, carotid upstroke brisk and symmetric, no bruits, no thyromegaly or adenopathy LUNGS:  Clear to auscultation bilaterally CHEST:  Unremarkable HEART:  IRRR,  PMI not displaced or sustained,S1 and S2 within normal limits, no S3, no S4: no clicks, no rubs, no murmurs ABD:  Soft, nontender. BS +, no masses or bruits. No hepatomegaly, no splenomegaly EXT:  2 + pulses throughout, no edema, no cyanosis no clubbing SKIN:  Warm and dry.  No rashes NEURO:  Alert and oriented x 3. Cranial nerves II through XII intact. PSYCH:  Cognitively intact   LABORATORY DATA: Labs reviewed from 09/04/16: cholesterol 208, triglycerides 79, HDL 71, LDL 121. CMET normal. Dated 10/03/17: cholesterol 185, triglycerides 69. HDL 70, LDL 101. Chemistries normal. Dated 11/13/18: cholesterol 199,  triglycerides 62, HDL 75, LDL 111. CMET normal. Dated 12/22/19: cholesterol 196, triglycerides 67, HDL 71, LDL 112. CMET, TSH, Hgb normal Dated 12/13/20: cholesterol 188, triglycerides 47, HDL 66, LDL 112. CMET and CBC normal.  Ecg today shows Afib with rate 88. Otherwise normal. I have personally reviewed and interpreted this study.   Assessment / Plan:  1. HTN- well controlled   2. Atrial fibrillation.  Appears to be permanent. She is  asymptomatic.  HR is well controlled on Cardizem CD.   Her CHAD-Vasc score is 3. Continue Xarelto.   3. Hyperlipidemia.  LDL is mildly elevated. Continue dietary modification.  Follow up in one year

## 2021-07-05 ENCOUNTER — Ambulatory Visit: Payer: Medicare HMO | Admitting: Cardiology

## 2021-07-05 ENCOUNTER — Encounter: Payer: Self-pay | Admitting: Cardiology

## 2021-07-05 ENCOUNTER — Other Ambulatory Visit: Payer: Self-pay

## 2021-07-05 VITALS — BP 136/82 | HR 88 | Ht 66.0 in | Wt 142.2 lb

## 2021-07-05 DIAGNOSIS — I10 Essential (primary) hypertension: Secondary | ICD-10-CM

## 2021-07-05 DIAGNOSIS — E7800 Pure hypercholesterolemia, unspecified: Secondary | ICD-10-CM

## 2021-07-05 DIAGNOSIS — I4821 Permanent atrial fibrillation: Secondary | ICD-10-CM

## 2021-09-20 DIAGNOSIS — L218 Other seborrheic dermatitis: Secondary | ICD-10-CM | POA: Diagnosis not present

## 2021-09-20 DIAGNOSIS — C44319 Basal cell carcinoma of skin of other parts of face: Secondary | ICD-10-CM | POA: Diagnosis not present

## 2021-09-20 DIAGNOSIS — L821 Other seborrheic keratosis: Secondary | ICD-10-CM | POA: Diagnosis not present

## 2021-09-20 DIAGNOSIS — L812 Freckles: Secondary | ICD-10-CM | POA: Diagnosis not present

## 2021-09-20 DIAGNOSIS — B078 Other viral warts: Secondary | ICD-10-CM | POA: Diagnosis not present

## 2021-09-20 DIAGNOSIS — D1801 Hemangioma of skin and subcutaneous tissue: Secondary | ICD-10-CM | POA: Diagnosis not present

## 2021-10-10 DIAGNOSIS — C44319 Basal cell carcinoma of skin of other parts of face: Secondary | ICD-10-CM | POA: Diagnosis not present

## 2021-11-22 ENCOUNTER — Other Ambulatory Visit: Payer: Self-pay | Admitting: Cardiology

## 2021-12-13 ENCOUNTER — Other Ambulatory Visit: Payer: Self-pay | Admitting: Cardiology

## 2021-12-13 DIAGNOSIS — H40013 Open angle with borderline findings, low risk, bilateral: Secondary | ICD-10-CM | POA: Diagnosis not present

## 2021-12-18 DIAGNOSIS — I10 Essential (primary) hypertension: Secondary | ICD-10-CM | POA: Diagnosis not present

## 2021-12-18 DIAGNOSIS — E7800 Pure hypercholesterolemia, unspecified: Secondary | ICD-10-CM | POA: Diagnosis not present

## 2021-12-18 DIAGNOSIS — E559 Vitamin D deficiency, unspecified: Secondary | ICD-10-CM | POA: Diagnosis not present

## 2021-12-21 DIAGNOSIS — I10 Essential (primary) hypertension: Secondary | ICD-10-CM | POA: Diagnosis not present

## 2021-12-21 DIAGNOSIS — I4891 Unspecified atrial fibrillation: Secondary | ICD-10-CM | POA: Diagnosis not present

## 2021-12-21 DIAGNOSIS — Z79899 Other long term (current) drug therapy: Secondary | ICD-10-CM | POA: Diagnosis not present

## 2021-12-21 DIAGNOSIS — Z0000 Encounter for general adult medical examination without abnormal findings: Secondary | ICD-10-CM | POA: Diagnosis not present

## 2021-12-21 DIAGNOSIS — D6869 Other thrombophilia: Secondary | ICD-10-CM | POA: Diagnosis not present

## 2021-12-21 DIAGNOSIS — E559 Vitamin D deficiency, unspecified: Secondary | ICD-10-CM | POA: Diagnosis not present

## 2021-12-21 DIAGNOSIS — E7800 Pure hypercholesterolemia, unspecified: Secondary | ICD-10-CM | POA: Diagnosis not present

## 2021-12-21 DIAGNOSIS — R7301 Impaired fasting glucose: Secondary | ICD-10-CM | POA: Diagnosis not present

## 2022-04-24 ENCOUNTER — Encounter (INDEPENDENT_AMBULATORY_CARE_PROVIDER_SITE_OTHER): Payer: Self-pay | Admitting: Ophthalmology

## 2022-04-24 ENCOUNTER — Ambulatory Visit (INDEPENDENT_AMBULATORY_CARE_PROVIDER_SITE_OTHER): Payer: Medicare HMO | Admitting: Ophthalmology

## 2022-04-24 DIAGNOSIS — H35352 Cystoid macular degeneration, left eye: Secondary | ICD-10-CM

## 2022-04-24 DIAGNOSIS — H353131 Nonexudative age-related macular degeneration, bilateral, early dry stage: Secondary | ICD-10-CM

## 2022-04-24 DIAGNOSIS — H40001 Preglaucoma, unspecified, right eye: Secondary | ICD-10-CM | POA: Diagnosis not present

## 2022-04-24 DIAGNOSIS — H35342 Macular cyst, hole, or pseudohole, left eye: Secondary | ICD-10-CM | POA: Diagnosis not present

## 2022-04-24 NOTE — Assessment & Plan Note (Signed)
Was a component of macular hole and this resolved ?

## 2022-04-24 NOTE — Assessment & Plan Note (Signed)
Small splinter hemorrhage along the inferior margin of the optic nerve right eye.  Patient is scheduled to see Dr. Ellie Lunch within 6 weeks ?

## 2022-04-24 NOTE — Progress Notes (Signed)
? ? ?04/24/2022 ? ?  ? ?CHIEF COMPLAINT ?Patient presents for  ?Chief Complaint  ?Patient presents with  ? Retina Evaluation  ? ? ? ? ?HISTORY OF PRESENT ILLNESS: ?Linda Snow is a 77 y.o. female who presents to the clinic today for:  ? ?HPI   ?1 yr fu OU oct.  History of macular hole and closure surgically 2020. ? ? ?Patient states vision is stable and unchanged since last visit. Denies any new floaters or FOL. ?Patient reports "long term floaters occasionally but no flashes." ?Last edited by Hurman Horn, MD on 04/24/2022 11:21 AM.  ?  ? ? ?Referring physician: ?Luberta Mutter, MD ?[address] ?[place],  [place] [id number] ? ?HISTORICAL INFORMATION:  ? ?Selected notes from the [facility] ?  ?   ? ?CURRENT MEDICATIONS: ?No current outpatient medications on file. (Ophthalmic Drugs)  ? ?No current facility-administered medications for this visit. (Ophthalmic Drugs)  ? ?Current Outpatient Medications (Other)  ?Medication Sig  ? benazepril (LOTENSIN) 5 MG tablet TAKE 1 TABLET BY MOUTH EVERY DAY  ? Cholecalciferol (VITAMIN D) 2000 UNITS CAPS Take by mouth daily.  ? diltiazem (CARDIZEM) 120 MG tablet TAKE 1 TABLET BY MOUTH EVERY DAY  ? Multiple Vitamin (MULTIVITAMIN) capsule Take 1 capsule by mouth daily.  ? rivaroxaban (XARELTO) 20 MG TABS tablet TAKE 1 TABLET BY MOUTH DAILY WITH SUPPER  ? vitamin C (ASCORBIC ACID) 500 MG tablet Take 500 mg by mouth daily.  ? ?No current facility-administered medications for this visit. (Other)  ? ? ? ? ?REVIEW OF SYSTEMS: ?ROS   ?Negative for: Constitutional, Gastrointestinal, Neurological, Skin, Genitourinary, Musculoskeletal, HENT, Endocrine, Cardiovascular, Eyes, Respiratory, Psychiatric, Allergic/Imm, Heme/Lymph ?Last edited by Hurman Horn, MD on 04/24/2022 11:17 AM.  ?  ? ? ? ?ALLERGIES ?Allergies  ?Allergen Reactions  ? Metoprolol Other (See Comments)  ?  hairloss  ? ? ?PAST MEDICAL HISTORY ?Past Medical History:  ?Diagnosis Date  ? Elevated C-reactive protein  (CRP)   ? Hyperlipidemia   ? Hypertension   ? PAF (paroxysmal atrial fibrillation) ([facility])   ? hx of,echo 06/09/2010 normal LV function and normal atrial size  ? ?History reviewed. No pertinent surgical history. ? ?FAMILY HISTORY ?Family History  ?Problem Relation Age of Onset  ? CVA Mother   ? Heart attack Mother   ? Hypertension Mother   ? Heart attack Father   ? Heart attack Brother   ? ? ?SOCIAL HISTORY ?Social History  ? ?Tobacco Use  ? Smoking status: Never  ? Smokeless tobacco: Never  ?Vaping Use  ? Vaping Use: Never used  ?Substance Use Topics  ? Alcohol use: No  ?  Alcohol/week: 0.0 standard drinks  ? Drug use: No  ? ?  ? ?  ? ?OPHTHALMIC EXAM: ? ?Base Eye Exam   ? ? Visual Acuity (ETDRS)   ? ?   Right Left  ? Dist  20/20 20/25 -1  ? ?  ?  ? ? Tonometry (Tonopen, 10:09 AM)   ? ?   Right Left  ? Pressure 12 17  ? ?  ?  ? ? Pupils   ? ?   Pupils Dark Light APD  ? Right PERRL 3 2 None  ? Left PERRL 3 2 None  ? ?  ?  ? ? Visual Fields (Counting fingers)   ? ?   Left Right  ?  Full Full  ? ?  ?  ? ? Extraocular Movement   ? ?  Right Left  ?  Full Full  ? ?  ?  ? ? Neuro/Psych   ? ? Oriented x3: Yes  ? Mood/Affect: Normal  ? ?  ?  ? ? Dilation   ? ? Both eyes: 1.0% Mydriacyl, 2.5% Phenylephrine @ 10:07 AM  ? ?  ?  ? ?  ? ?Slit Lamp and Fundus Exam   ? ? External Exam   ? ?   Right Left  ? External Normal Normal  ? ?  ?  ? ? Slit Lamp Exam   ? ?   Right Left  ? Lids/Lashes Normal Normal  ? Conjunctiva/Sclera White and quiet White and quiet  ? Cornea Clear Clear  ? Anterior Chamber Deep and quiet Deep and quiet  ? Iris Round and reactive Round and reactive  ? Lens Posterior chamber intraocular lens Posterior chamber intraocular lens  ? Anterior Vitreous Normal Normal  ? ?  ?  ? ? Fundus Exam   ? ?   Right Left  ? Posterior Vitreous Posterior vitreous detachment Normal,, clear vitrectomized  ? Disc Normal, inferior margin, small splinter hemorrhage Normal  ? C/D Ratio 0.5 0.5  ? Macula Drusen, Hard drusen Drusen,  Hard drusen, no hole  ? Vessels Normal Normal  ? Periphery Normal Normal, no heme  ? ?  ?  ? ?  ? ? ?IMAGING AND PROCEDURES  ?Imaging and Procedures for 04/24/22 ? ?OCT, Retina - OU - Both Eyes   ? ?   ?Right Eye ?Quality was good. Scan locations included subfoveal. Central Foveal Thickness: 324. Progression has been stable. Findings include normal observations, retinal drusen .  ? ?Left Eye ?Quality was good. Scan locations included subfoveal. Central Foveal Thickness: 323. Progression has improved. Findings include outer retinal atrophy, abnormal foveal contour, retinal drusen .  ? ?Notes ?History of macular hole left eye and its closure March 2020, outer retina stable, hole closed ? ?OD normal  ? ?each eye with minor hard drusen changes at proximal membrane ? ?  ? ? ?  ?  ? ?  ?ASSESSMENT/PLAN: ? ?Macular hole of left eye ?History of macular hole and surgical repair via vitrectomy membrane peel March 2020 with hole closed and improved acuity stable over time ? ?Cystoid macular edema of left eye ?Was a component of macular hole and this resolved ? ?Early stage nonexudative age-related macular degeneration of both eyes ?Mild AMD in the macular region.  Extensive peripheral drusen outside the temporal arcades may very well reflect this patient's lifelong use of sunglasses while outdoors.  In the macular condition, only mild hard drusen are seen ? 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  Patient is scheduled to see Dr. Ellie Lunch within 6 weeks  ? ?  ICD-10-CM   ?1. Lamellar macular hole of left eye  H35.342 OCT, Retina - OU - Both Eyes  ?  ?2. Macular hole of left eye  H35.342   ?  ?3. Cystoid macular edema of left eye  H35.352   ?  ?4. Early stage nonexudative age-related macular degeneration of both eyes  H35.3131   ?  ?5. 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  ? ? ?1.  OS, macular hole successfully closed.  Irregular macula as a result of  the previous hole itself and resolution of CME ? ?2.  ARMD OU, will observe ? ?3.  Incidental finding of a small splinter  hemorrhage in the inferior pole, 6 meridian on the disc margin, suggestive of possible glaucoma suspect.  Follow-up in evaluation with Dr. Luberta Mutter within the coming 6 to 8 weeks as scheduled ? ?Ophthalmic Meds Ordered this visit:  ?No orders of the defined types were placed in this encounter. ? ? ?  ? ?Return in about 1 year (around 04/25/2023) for DILATE OU, OD, OS. ? ?There are no Patient Instructions on file for this visit. ? ? ?Explained the diagnoses, plan, and follow up with the patient and they expressed understanding.  Patient expressed understanding of the importance of proper follow up care.  ? ?Clent Demark. Kahlen Boyde M.D. ?Diseases & Surgery of the Retina and Vitreous ?[place] ?04/24/22 ? ? ? ? ?Abbreviations: ?M myopia (nearsighted); A astigmatism; H hyperopia (farsighted); P presbyopia; Mrx spectacle prescription;  CTL contact lenses; OD right eye; OS left eye; OU both eyes  XT exotropia; ET esotropia; PEK punctate epithelial keratitis; PEE punctate epithelial erosions; DES dry eye syndrome; MGD meibomian gland dysfunction; ATs artificial tears; PFAT's preservative free artificial tears; [place] nuclear sclerotic cataract; PSC posterior subcapsular cataract; ERM epi-retinal membrane; PVD posterior vitreous detachment; RD retinal detachment; DM diabetes mellitus; DR diabetic retinopathy; NPDR non-proliferative diabetic retinopathy; PDR proliferative diabetic retinopathy; CSME clinically significant macular edema; DME diabetic macular edema; dbh dot blot hemorrhages; CWS cotton wool spot; POAG primary open angle glaucoma; C/D cup-to-disc ratio; HVF humphrey visual field; GVF goldmann visual field; OCT optical coherence tomography; IOP intraocular pressure; BRVO Branch retinal vein occlusion; CRVO central retinal vein occlusion; CRAO central retinal artery occlusion;  BRAO branch retinal artery occlusion; RT retinal tear; SB scleral buckle; PPV pars plana vitrectomy; VH Vitreous hemorrhage; PRP panretinal laser photocoagulation; IVK intravitreal kenalog; VMT vitreomacul

## 2022-04-24 NOTE — Assessment & Plan Note (Signed)
History of macular hole and surgical repair via vitrectomy membrane peel March 2020 with hole closed and improved acuity stable over time ?

## 2022-04-24 NOTE — Assessment & Plan Note (Signed)
Mild AMD in the macular region.  Extensive peripheral drusen outside the temporal arcades may very well reflect this patient's lifelong use of sunglasses while outdoors.  In the macular condition, only mild hard drusen are seen ?

## 2022-04-26 ENCOUNTER — Other Ambulatory Visit: Payer: Self-pay | Admitting: Cardiology

## 2022-05-23 DIAGNOSIS — Z01419 Encounter for gynecological examination (general) (routine) without abnormal findings: Secondary | ICD-10-CM | POA: Diagnosis not present

## 2022-06-13 DIAGNOSIS — H401131 Primary open-angle glaucoma, bilateral, mild stage: Secondary | ICD-10-CM | POA: Diagnosis not present

## 2022-06-13 DIAGNOSIS — Z961 Presence of intraocular lens: Secondary | ICD-10-CM | POA: Diagnosis not present

## 2022-06-20 DIAGNOSIS — Z1231 Encounter for screening mammogram for malignant neoplasm of breast: Secondary | ICD-10-CM | POA: Diagnosis not present

## 2022-07-04 DIAGNOSIS — H401131 Primary open-angle glaucoma, bilateral, mild stage: Secondary | ICD-10-CM | POA: Diagnosis not present

## 2022-07-11 DIAGNOSIS — H401111 Primary open-angle glaucoma, right eye, mild stage: Secondary | ICD-10-CM | POA: Diagnosis not present

## 2022-07-15 NOTE — Progress Notes (Unsigned)
Linda Snow Date of Birth: 1945/10/11 Medical Record #[id number]  History of Present Illness: Linda Snow is seen for  followup Afib. She has a history of permanent atrial fibrillation, hypertension, and hypercholesterolemia. She also has a family history of early coronary disease. She has had an elevated CRP level in the past. She wore a Holter monitor in June 2018 which showed Afib with suboptimal HR control. Cardizem CD 120 mg daily was added.   On  followup today she states she is doing very well from a cardiac standpoint. She denies any significant palpitations.   HR typically in the 80s. BP has been well controlled. No chest pain, dyspnea. No edema. She continues to be active with walking  or water aerobics. She is really not aware of Afib.  Current Outpatient Medications on File Prior to Visit  Medication Sig Dispense Refill   benazepril (LOTENSIN) 5 MG tablet TAKE 1 TABLET BY MOUTH EVERY DAY 90 tablet 3   Cholecalciferol (VITAMIN D) 2000 UNITS CAPS Take by mouth daily.     diltiazem (CARDIZEM) 120 MG tablet TAKE 1 TABLET BY MOUTH EVERY DAY 90 tablet 3   latanoprost (XALATAN) 0.005 % ophthalmic solution Place 1 drop into both eyes at bedtime.     Multiple Vitamin (MULTIVITAMIN) capsule Take 1 capsule by mouth daily.     rivaroxaban (XARELTO) 20 MG TABS tablet TAKE 1 TABLET BY MOUTH DAILY WITH SUPPER 90 tablet 3   vitamin C (ASCORBIC ACID) 500 MG tablet Take 500 mg by mouth daily.     No current facility-administered medications on file prior to visit.    Allergies  Allergen Reactions   Metoprolol Other (See Comments)    hairloss    Past Medical History:  Diagnosis Date   Elevated C-reactive protein (CRP)    Hyperlipidemia    Hypertension    PAF (paroxysmal atrial fibrillation) ([facility])    hx of,echo 06/09/2010 normal LV function and normal atrial size    History reviewed. No pertinent surgical history.  Social History   Tobacco Use  Smoking Status Never   Smokeless Tobacco Never    Social History   Substance and Sexual Activity  Alcohol Use No   Alcohol/week: 0.0 standard drinks of alcohol    Family History  Problem Relation Age of Onset   CVA Mother    Heart attack Mother    Hypertension Mother    Heart attack Father    Heart attack Brother     Review of Systems: As noted in HPI. All other systems were reviewed and are negative.  Physical Exam: BP 108/76   Pulse (!) 50   Ht '5\' 6"'$  (1.676 m)   Wt 138 lb 3.2 oz (62.7 kg)   LMP  (LMP Unknown)   SpO2 97%   BMI 22.31 kg/m  Repeat pulse was 75.  GENERAL:  Well appearing WF in NAD HEENT:  PERRL, EOMI, sclera are clear. Oropharynx is clear. NECK:  No jugular venous distention, carotid upstroke brisk and symmetric, no bruits, no thyromegaly or adenopathy LUNGS:  Clear to auscultation bilaterally CHEST:  Unremarkable HEART:  IRRR,  PMI not displaced or sustained,S1 and S2 within normal limits, no S3, no S4: no clicks, no rubs, no murmurs ABD:  Soft, nontender. BS +, no masses or bruits. No hepatomegaly, no splenomegaly EXT:  2 + pulses throughout, no edema, no cyanosis no clubbing SKIN:  Warm and dry.  No rashes NEURO:  Alert and oriented x 3. Cranial nerves II  through XII intact. PSYCH:  Cognitively intact   LABORATORY DATA: Labs reviewed from 09/04/16: cholesterol 208, triglycerides 79, HDL 71, LDL 121. CMET normal. Dated 10/03/17: cholesterol 185, triglycerides 69. HDL 70, LDL 101. Chemistries normal. Dated 11/13/18: cholesterol 199, triglycerides 62, HDL 75, LDL 111. CMET normal. Dated 12/22/19: cholesterol 196, triglycerides 67, HDL 71, LDL 112. CMET, TSH, Hgb normal Dated 12/13/20: cholesterol 188, triglycerides 47, HDL 66, LDL 112. CMET and CBC normal. Dated 12/18/21: cholesterol 190, triglycerides 54, HDL 74, LDL 106. CMET normal  Ecg today shows Afib with rate 50. Otherwise normal. I have personally reviewed and interpreted this study.   Assessment / Plan:  1.  HTN- well controlled   2. Atrial fibrillation.  Permanent. She is  asymptomatic.  HR is well controlled on Cardizem CD.   Her CHAD-Vasc score is 3. Continue Xarelto.   3. Hyperlipidemia.  LDL is mildly elevated. Continue dietary modification.  Follow up in one year

## 2022-07-18 ENCOUNTER — Ambulatory Visit: Payer: Medicare HMO | Admitting: Cardiology

## 2022-07-18 ENCOUNTER — Encounter: Payer: Self-pay | Admitting: Cardiology

## 2022-07-18 VITALS — BP 108/76 | HR 50 | Ht 66.0 in | Wt 138.2 lb

## 2022-07-18 DIAGNOSIS — E7800 Pure hypercholesterolemia, unspecified: Secondary | ICD-10-CM | POA: Diagnosis not present

## 2022-07-18 DIAGNOSIS — I4821 Permanent atrial fibrillation: Secondary | ICD-10-CM | POA: Diagnosis not present

## 2022-07-18 DIAGNOSIS — I10 Essential (primary) hypertension: Secondary | ICD-10-CM

## 2022-07-20 DIAGNOSIS — H401122 Primary open-angle glaucoma, left eye, moderate stage: Secondary | ICD-10-CM | POA: Diagnosis not present

## 2022-09-19 DIAGNOSIS — D1801 Hemangioma of skin and subcutaneous tissue: Secondary | ICD-10-CM | POA: Diagnosis not present

## 2022-09-19 DIAGNOSIS — L720 Epidermal cyst: Secondary | ICD-10-CM | POA: Diagnosis not present

## 2022-09-19 DIAGNOSIS — Z85828 Personal history of other malignant neoplasm of skin: Secondary | ICD-10-CM | POA: Diagnosis not present

## 2022-09-19 DIAGNOSIS — L812 Freckles: Secondary | ICD-10-CM | POA: Diagnosis not present

## 2022-09-19 DIAGNOSIS — L821 Other seborrheic keratosis: Secondary | ICD-10-CM | POA: Diagnosis not present

## 2022-09-29 ENCOUNTER — Other Ambulatory Visit: Payer: Self-pay | Admitting: Cardiology

## 2022-12-07 ENCOUNTER — Other Ambulatory Visit: Payer: Self-pay | Admitting: Cardiology

## 2022-12-07 NOTE — Telephone Encounter (Signed)
Prescription refill request for Xarelto received.  Indication:afib Last office visit:7/23 Weight:62.7 kg Age:77 Scr:0.6 CrCl:76.49  ml/min  Prescription refilled

## 2022-12-21 DIAGNOSIS — E559 Vitamin D deficiency, unspecified: Secondary | ICD-10-CM | POA: Diagnosis not present

## 2022-12-21 DIAGNOSIS — R7301 Impaired fasting glucose: Secondary | ICD-10-CM | POA: Diagnosis not present

## 2022-12-21 DIAGNOSIS — Z79899 Other long term (current) drug therapy: Secondary | ICD-10-CM | POA: Diagnosis not present

## 2022-12-21 DIAGNOSIS — E7800 Pure hypercholesterolemia, unspecified: Secondary | ICD-10-CM | POA: Diagnosis not present

## 2022-12-21 DIAGNOSIS — I10 Essential (primary) hypertension: Secondary | ICD-10-CM | POA: Diagnosis not present

## 2023-01-02 DIAGNOSIS — E7800 Pure hypercholesterolemia, unspecified: Secondary | ICD-10-CM | POA: Diagnosis not present

## 2023-01-02 DIAGNOSIS — Z0000 Encounter for general adult medical examination without abnormal findings: Secondary | ICD-10-CM | POA: Diagnosis not present

## 2023-01-02 DIAGNOSIS — R7301 Impaired fasting glucose: Secondary | ICD-10-CM | POA: Diagnosis not present

## 2023-01-02 DIAGNOSIS — E559 Vitamin D deficiency, unspecified: Secondary | ICD-10-CM | POA: Diagnosis not present

## 2023-01-02 DIAGNOSIS — D6869 Other thrombophilia: Secondary | ICD-10-CM | POA: Diagnosis not present

## 2023-01-02 DIAGNOSIS — Z6822 Body mass index (BMI) 22.0-22.9, adult: Secondary | ICD-10-CM | POA: Diagnosis not present

## 2023-01-02 DIAGNOSIS — I10 Essential (primary) hypertension: Secondary | ICD-10-CM | POA: Diagnosis not present

## 2023-01-15 DIAGNOSIS — R69 Illness, unspecified: Secondary | ICD-10-CM | POA: Diagnosis not present

## 2023-02-22 DIAGNOSIS — Z961 Presence of intraocular lens: Secondary | ICD-10-CM | POA: Diagnosis not present

## 2023-02-22 DIAGNOSIS — H401121 Primary open-angle glaucoma, left eye, mild stage: Secondary | ICD-10-CM | POA: Diagnosis not present

## 2023-03-27 ENCOUNTER — Other Ambulatory Visit: Payer: Self-pay | Admitting: Cardiology

## 2023-04-25 ENCOUNTER — Encounter (INDEPENDENT_AMBULATORY_CARE_PROVIDER_SITE_OTHER): Payer: Medicare HMO | Admitting: Ophthalmology

## 2023-05-01 ENCOUNTER — Encounter (INDEPENDENT_AMBULATORY_CARE_PROVIDER_SITE_OTHER): Payer: Medicare HMO | Admitting: Ophthalmology

## 2023-05-07 DIAGNOSIS — H35342 Macular cyst, hole, or pseudohole, left eye: Secondary | ICD-10-CM | POA: Diagnosis not present

## 2023-05-07 DIAGNOSIS — H401132 Primary open-angle glaucoma, bilateral, moderate stage: Secondary | ICD-10-CM | POA: Diagnosis not present

## 2023-05-07 DIAGNOSIS — H47091 Other disorders of optic nerve, not elsewhere classified, right eye: Secondary | ICD-10-CM | POA: Diagnosis not present

## 2023-05-07 DIAGNOSIS — H353131 Nonexudative age-related macular degeneration, bilateral, early dry stage: Secondary | ICD-10-CM | POA: Diagnosis not present

## 2023-05-09 DIAGNOSIS — H401131 Primary open-angle glaucoma, bilateral, mild stage: Secondary | ICD-10-CM | POA: Diagnosis not present

## 2023-05-17 DIAGNOSIS — Z7901 Long term (current) use of anticoagulants: Secondary | ICD-10-CM | POA: Diagnosis not present

## 2023-05-17 DIAGNOSIS — D6869 Other thrombophilia: Secondary | ICD-10-CM | POA: Diagnosis not present

## 2023-05-17 DIAGNOSIS — Z974 Presence of external hearing-aid: Secondary | ICD-10-CM | POA: Diagnosis not present

## 2023-05-17 DIAGNOSIS — E785 Hyperlipidemia, unspecified: Secondary | ICD-10-CM | POA: Diagnosis not present

## 2023-05-17 DIAGNOSIS — Z9181 History of falling: Secondary | ICD-10-CM | POA: Diagnosis not present

## 2023-05-17 DIAGNOSIS — M810 Age-related osteoporosis without current pathological fracture: Secondary | ICD-10-CM | POA: Diagnosis not present

## 2023-05-17 DIAGNOSIS — Z8249 Family history of ischemic heart disease and other diseases of the circulatory system: Secondary | ICD-10-CM | POA: Diagnosis not present

## 2023-05-17 DIAGNOSIS — I739 Peripheral vascular disease, unspecified: Secondary | ICD-10-CM | POA: Diagnosis not present

## 2023-05-17 DIAGNOSIS — R32 Unspecified urinary incontinence: Secondary | ICD-10-CM | POA: Diagnosis not present

## 2023-05-17 DIAGNOSIS — I4891 Unspecified atrial fibrillation: Secondary | ICD-10-CM | POA: Diagnosis not present

## 2023-05-17 DIAGNOSIS — I10 Essential (primary) hypertension: Secondary | ICD-10-CM | POA: Diagnosis not present

## 2023-05-17 DIAGNOSIS — Z008 Encounter for other general examination: Secondary | ICD-10-CM | POA: Diagnosis not present

## 2023-05-17 DIAGNOSIS — H409 Unspecified glaucoma: Secondary | ICD-10-CM | POA: Diagnosis not present

## 2023-05-29 DIAGNOSIS — M8580 Other specified disorders of bone density and structure, unspecified site: Secondary | ICD-10-CM | POA: Diagnosis not present

## 2023-05-29 DIAGNOSIS — I4891 Unspecified atrial fibrillation: Secondary | ICD-10-CM | POA: Diagnosis not present

## 2023-05-29 DIAGNOSIS — Z780 Asymptomatic menopausal state: Secondary | ICD-10-CM | POA: Diagnosis not present

## 2023-05-29 DIAGNOSIS — K629 Disease of anus and rectum, unspecified: Secondary | ICD-10-CM | POA: Diagnosis not present

## 2023-05-29 DIAGNOSIS — H409 Unspecified glaucoma: Secondary | ICD-10-CM | POA: Diagnosis not present

## 2023-05-29 DIAGNOSIS — Z01419 Encounter for gynecological examination (general) (routine) without abnormal findings: Secondary | ICD-10-CM | POA: Diagnosis not present

## 2023-05-29 DIAGNOSIS — I10 Essential (primary) hypertension: Secondary | ICD-10-CM | POA: Diagnosis not present

## 2023-06-06 DIAGNOSIS — K629 Disease of anus and rectum, unspecified: Secondary | ICD-10-CM | POA: Diagnosis not present

## 2023-06-06 DIAGNOSIS — L900 Lichen sclerosus et atrophicus: Secondary | ICD-10-CM | POA: Diagnosis not present

## 2023-06-22 ENCOUNTER — Other Ambulatory Visit: Payer: Self-pay | Admitting: Cardiology

## 2023-06-26 DIAGNOSIS — M8589 Other specified disorders of bone density and structure, multiple sites: Secondary | ICD-10-CM | POA: Diagnosis not present

## 2023-06-26 DIAGNOSIS — Z1231 Encounter for screening mammogram for malignant neoplasm of breast: Secondary | ICD-10-CM | POA: Diagnosis not present

## 2023-06-26 DIAGNOSIS — N951 Menopausal and female climacteric states: Secondary | ICD-10-CM | POA: Diagnosis not present

## 2023-07-26 NOTE — Progress Notes (Unsigned)
Linda Snow Date of Birth: 08-22-1945 Medical Record #[id number]  History of Present Illness: Linda Snow is seen for  followup Afib. She has a history of permanent atrial fibrillation, hypertension, and hypercholesterolemia. She also has a family history of early coronary disease. She has had an elevated CRP level in the past. She wore a Holter monitor in June 2018 which showed Afib with suboptimal HR control. Cardizem CD 120 mg daily was added.   On  followup today she states she is doing very well from a cardiac standpoint. She denies any significant palpitations. BP has been well controlled. No chest pain, dyspnea. No edema. No dizziness. She did have an insurance test showing mildly reduced ABIs of 0.68 on the right and 0.73 on the left. She has no claudication symptoms. She continues to be active with walking  or water aerobics. She is really not aware of Afib.  Current Outpatient Medications on File Prior to Visit  Medication Sig Dispense Refill   benazepril (LOTENSIN) 5 MG tablet TAKE 1 TABLET BY MOUTH EVERY DAY 90 tablet 3   Cholecalciferol (VITAMIN D) 2000 UNITS CAPS Take by mouth daily.     dorzolamide-timolol (COSOPT) 2-0.5 % ophthalmic solution Place 1 drop into both eyes 2 (two) times daily.     latanoprost (XALATAN) 0.005 % ophthalmic solution Place 1 drop into both eyes at bedtime.     Multiple Vitamin (MULTIVITAMIN) capsule Take 1 capsule by mouth daily.     vitamin C (ASCORBIC ACID) 500 MG tablet Take 500 mg by mouth daily.     XARELTO 20 MG TABS tablet TAKE 1 TABLET BY MOUTH DAILY WITH SUPPER 90 tablet 3   No current facility-administered medications on file prior to visit.    Allergies  Allergen Reactions   Metoprolol Other (See Comments)    hairloss    Past Medical History:  Diagnosis Date   Elevated C-reactive protein (CRP)    Hyperlipidemia    Hypertension    PAF (paroxysmal atrial fibrillation) ([facility])    hx of,echo 06/09/2010 normal LV function and  normal atrial size    History reviewed. No pertinent surgical history.  Social History   Tobacco Use  Smoking Status Never  Smokeless Tobacco Never    Social History   Substance and Sexual Activity  Alcohol Use No   Alcohol/week: 0.0 standard drinks of alcohol    Family History  Problem Relation Age of Onset   CVA Mother    Heart attack Mother    Hypertension Mother    Heart attack Father    Heart attack Brother     Review of Systems: As noted in HPI. All other systems were reviewed and are negative.  Physical Exam: BP 112/72 (BP Location: Left Arm, Patient Position: Sitting, Cuff Size: Normal)   Pulse (!) 46   Ht 5\' 6"  (1.676 m)   Wt 137 lb 6.4 oz (62.3 kg)   LMP  (LMP Unknown)   SpO2 98%   BMI 22.18 kg/m  Repeat pulse was 75.  GENERAL:  Well appearing WF in NAD HEENT:  PERRL, EOMI, sclera are clear. Oropharynx is clear. NECK:  No jugular venous distention, carotid upstroke brisk and symmetric, no bruits, no thyromegaly or adenopathy LUNGS:  Clear to auscultation bilaterally CHEST:  Unremarkable HEART:  IRRR,  PMI not displaced or sustained,S1 and S2 within normal limits, no S3, no S4: no clicks, no rubs, no murmurs ABD:  Soft, nontender. BS +, no masses or bruits. No hepatomegaly,  no splenomegaly EXT:  2 + pulses throughout, no edema, no cyanosis no clubbing SKIN:  Warm and dry.  No rashes NEURO:  Alert and oriented x 3. Cranial nerves II through XII intact. PSYCH:  Cognitively intact   LABORATORY DATA: Labs reviewed from 09/04/16: cholesterol 208, triglycerides 79, HDL 71, LDL 121. CMET normal. Dated 10/03/17: cholesterol 185, triglycerides 69. HDL 70, LDL 101. Chemistries normal. Dated 11/13/18: cholesterol 199, triglycerides 62, HDL 75, LDL 111. CMET normal. Dated 12/22/19: cholesterol 196, triglycerides 67, HDL 71, LDL 112. CMET, TSH, Hgb normal Dated 12/13/20: cholesterol 188, triglycerides 47, HDL 66, LDL 112. CMET and CBC normal. Dated 12/18/21:  cholesterol 190, triglycerides 54, HDL 74, LDL 106. CMET normal Dated 12/21/22: cholesterol 168, triglycerides 48, HDL 74, LDL 84. A1c 5.5%. CMET and CBC normal.  EKG Interpretation Date/Time:  Monday July 29 2023 08:09:11 EDT Ventricular Rate:  46 PR Interval:    QRS Duration:  86 QT Interval:  450 QTC Calculation: 393 R Axis:   88  Text Interpretation: Atrial fibrillation with slow ventricular response Nonspecific ST abnormality  no change compared to 07/18/22 Confirmed by [place], Jady Braggs [phone]) on 07/29/2023 8:14:06 AM    Assessment / Plan:  1. HTN- well controlled   2. Atrial fibrillation.  Permanent. She is  asymptomatic.  HR is slow on diltiazem. I have recommended she discontinue this now. Only on a low dose.   Her CHAD-Vasc score is 3. Continue Xarelto.   3. Hyperlipidemia.  LDL is mildly elevated at 84. It has dropped 20 points in the past year with dietary therapy. Continue dietary modification.  4. ? PAD. Mild reduction in ABIs on home test. No claudication symptoms. Normal pulses.   Follow up in one year

## 2023-07-29 ENCOUNTER — Ambulatory Visit: Payer: Medicare HMO | Attending: Cardiology | Admitting: Cardiology

## 2023-07-29 ENCOUNTER — Encounter: Payer: Self-pay | Admitting: Cardiology

## 2023-07-29 VITALS — BP 112/72 | HR 46 | Ht 66.0 in | Wt 137.4 lb

## 2023-07-29 DIAGNOSIS — E7800 Pure hypercholesterolemia, unspecified: Secondary | ICD-10-CM | POA: Diagnosis not present

## 2023-07-29 DIAGNOSIS — I10 Essential (primary) hypertension: Secondary | ICD-10-CM | POA: Diagnosis not present

## 2023-07-29 DIAGNOSIS — I4821 Permanent atrial fibrillation: Secondary | ICD-10-CM

## 2023-07-29 NOTE — Patient Instructions (Signed)
Medication Instructions:  Stop Diltiazem Continue all other medications *If you need a refill on your cardiac medications before your next appointment, please call your pharmacy*   Lab Work: None ordered   Testing/Procedures: None ordered   Follow-Up: At [facility], you and your health needs are our priority.  As part of our continuing mission to provide you with exceptional heart care, we have created designated Provider Care Teams.  These Care Teams include your primary Cardiologist (physician) and Advanced Practice Providers (APPs -  Physician Assistants and Nurse Practitioners) who all work together to provide you with the care you need, when you need it.  We recommend signing up for the patient portal called "MyChart".  Sign up information is provided on this After Visit Summary.  MyChart is used to connect with patients for Virtual Visits (Telemedicine).  Patients are able to view lab/test results, encounter notes, upcoming appointments, etc.  Non-urgent messages can be sent to your provider as well.   To learn more about what you can do with MyChart, go to [email or web address].    Your next appointment:  1 year    Call in March to schedule July appointment     Provider:  Dr.Jordan

## 2023-08-28 DIAGNOSIS — H353131 Nonexudative age-related macular degeneration, bilateral, early dry stage: Secondary | ICD-10-CM | POA: Diagnosis not present

## 2023-08-28 DIAGNOSIS — Z961 Presence of intraocular lens: Secondary | ICD-10-CM | POA: Diagnosis not present

## 2023-08-28 DIAGNOSIS — H401131 Primary open-angle glaucoma, bilateral, mild stage: Secondary | ICD-10-CM | POA: Diagnosis not present

## 2023-09-18 ENCOUNTER — Other Ambulatory Visit: Payer: Self-pay | Admitting: Cardiology

## 2023-10-30 DIAGNOSIS — L812 Freckles: Secondary | ICD-10-CM | POA: Diagnosis not present

## 2023-10-30 DIAGNOSIS — L821 Other seborrheic keratosis: Secondary | ICD-10-CM | POA: Diagnosis not present

## 2023-10-30 DIAGNOSIS — Z85828 Personal history of other malignant neoplasm of skin: Secondary | ICD-10-CM | POA: Diagnosis not present

## 2023-10-30 DIAGNOSIS — D1801 Hemangioma of skin and subcutaneous tissue: Secondary | ICD-10-CM | POA: Diagnosis not present

## 2023-11-19 ENCOUNTER — Other Ambulatory Visit: Payer: Self-pay | Admitting: Cardiology

## 2023-11-19 NOTE — Telephone Encounter (Signed)
Prescription refill request for Xarelto received.  Indication: AF Last office visit: 07/29/23  P [place] MD Weight: 62.3kg Age: 78 GFR  92  Based on above findings Xarelto 20mg  daily is the appropriate dose.  Refill approved.  Pt needs lab work at f/u appt.

## 2024-01-17 DIAGNOSIS — E7800 Pure hypercholesterolemia, unspecified: Secondary | ICD-10-CM | POA: Diagnosis not present

## 2024-01-17 DIAGNOSIS — E559 Vitamin D deficiency, unspecified: Secondary | ICD-10-CM | POA: Diagnosis not present

## 2024-01-17 DIAGNOSIS — R7301 Impaired fasting glucose: Secondary | ICD-10-CM | POA: Diagnosis not present

## 2024-01-17 DIAGNOSIS — I10 Essential (primary) hypertension: Secondary | ICD-10-CM | POA: Diagnosis not present

## 2024-01-17 LAB — LAB REPORT - SCANNED
A1c: 5.7
Creatinine, POC: 50 mg/dL
EGFR: 91
Microalb Creat Ratio: 15.1
Microalbumin, Urine: 0.75

## 2024-01-22 DIAGNOSIS — Z0000 Encounter for general adult medical examination without abnormal findings: Secondary | ICD-10-CM | POA: Diagnosis not present

## 2024-01-22 DIAGNOSIS — Z23 Encounter for immunization: Secondary | ICD-10-CM | POA: Diagnosis not present

## 2024-01-22 DIAGNOSIS — I10 Essential (primary) hypertension: Secondary | ICD-10-CM | POA: Diagnosis not present

## 2024-01-22 DIAGNOSIS — R7301 Impaired fasting glucose: Secondary | ICD-10-CM | POA: Diagnosis not present

## 2024-01-22 DIAGNOSIS — M8588 Other specified disorders of bone density and structure, other site: Secondary | ICD-10-CM | POA: Diagnosis not present

## 2024-01-22 DIAGNOSIS — E7800 Pure hypercholesterolemia, unspecified: Secondary | ICD-10-CM | POA: Diagnosis not present

## 2024-01-22 DIAGNOSIS — E559 Vitamin D deficiency, unspecified: Secondary | ICD-10-CM | POA: Diagnosis not present

## 2024-01-22 DIAGNOSIS — I4891 Unspecified atrial fibrillation: Secondary | ICD-10-CM | POA: Diagnosis not present

## 2024-03-04 DIAGNOSIS — H401131 Primary open-angle glaucoma, bilateral, mild stage: Secondary | ICD-10-CM | POA: Diagnosis not present

## 2024-03-23 ENCOUNTER — Telehealth: Payer: Self-pay | Admitting: Cardiology

## 2024-03-23 MED ORDER — BENAZEPRIL HCL 20 MG PO TABS: 20.0000 mg | ORAL_TABLET | Freq: Every day | ORAL | 1 refills | Status: DC

## 2024-03-23 NOTE — Telephone Encounter (Signed)
 Patient called. Advised OK to take extra 10mg  benazepril = 20mg . Advised new Rx will be for 1 - 20mg  tablet. She voiced understanding.

## 2024-03-23 NOTE — Telephone Encounter (Signed)
 [place], Peter M, MD  You; Charna Elizabeth, [place] minutes ago (9:51 AM)    I would increase benazepril to 20 mg daily for better BP control. As noted she has permanent Afib so as long as HR is ok nothing needed for that  Peter [place] MD, [facility]

## 2024-03-23 NOTE — Telephone Encounter (Signed)
 Patient is returning call regarding medication changes + she wants to know if she should go ahead and take another 10 MG tonight. Please advise.

## 2024-03-23 NOTE — Telephone Encounter (Signed)
 Spoke with patient of Dr. [place]. She reports a spike in BP, AFib. Has h/o perm AFib.   Her PCP increased benazepril to 10mg  in Jan 2025 - BP then: 150/89  Blood pressure - since increasing benazepril 10mg .  - Average: 140s-150s systolic - 148/118, HR 87 (after med this morning) - 152/108, HR 94 (3/23) - 153/119, HR 91 (3/23) - 190/128, HR 80 (3/23 evening) - 145/107, HR 80 (3/23 - 6pm)  AFib - reports noticing more episodes x3 weeks. More frequent, more noticeable at night. Denies chest pains, denies shortness of breath. She had previously been on diltiazem 120mg  once daily but this was d/c'ed due to bradycardia 07/2023.   Advised will have MD review message and advise on treatment plan modifications.   OK to leave message if patient unavailable.

## 2024-03-23 NOTE — Telephone Encounter (Signed)
 Patient c/o Palpitations:  High priority if patient c/o lightheadedness, shortness of breath, or chest pain  How long have you had palpitations/irregular HR/ Afib? Are you having the symptoms now?  Afib for about 2 weeks and patient is not currently in afib.  Are you currently experiencing lightheadedness, SOB or CP?  No   Do you have a history of afib (atrial fibrillation) or irregular heart rhythm?  Hx afib  Have you checked your BP or HR? (document readings if available):  BP is elevated around 151/99 80 and higher  Are you experiencing any other symptoms?  No

## 2024-03-23 NOTE — Telephone Encounter (Signed)
 Left message for patient, per request, with changes to meds per MD. Rx(s) sent to pharmacy electronically.

## 2024-04-09 ENCOUNTER — Ambulatory Visit: Attending: Cardiology | Admitting: Cardiology

## 2024-04-09 ENCOUNTER — Telehealth: Payer: Self-pay | Admitting: Cardiology

## 2024-04-09 ENCOUNTER — Telehealth: Payer: Self-pay | Admitting: Physician Assistant

## 2024-04-09 ENCOUNTER — Encounter: Payer: Self-pay | Admitting: Cardiology

## 2024-04-09 VITALS — BP 146/82 | HR 80 | Ht 66.0 in | Wt 126.0 lb

## 2024-04-09 DIAGNOSIS — E7800 Pure hypercholesterolemia, unspecified: Secondary | ICD-10-CM

## 2024-04-09 DIAGNOSIS — I4821 Permanent atrial fibrillation: Secondary | ICD-10-CM | POA: Diagnosis not present

## 2024-04-09 DIAGNOSIS — I10 Essential (primary) hypertension: Secondary | ICD-10-CM | POA: Diagnosis not present

## 2024-04-09 MED ORDER — CARVEDILOL 3.125 MG PO TABS: 3.1250 mg | ORAL_TABLET | Freq: Two times a day (BID) | ORAL | 3 refills | Status: DC

## 2024-04-09 NOTE — Progress Notes (Signed)
 Cardiology Office Note:  .   Date:  04/09/2024  ID:  YOVANA SCOGIN, DOB 1945/11/18, MRN [id number] PCP: Daisy Floro, MD  [facility] Providers Cardiologist:  Peter Swaziland, MD    History of Present Illness: Linda Snow   Linda Snow is a 79 y.o. female with a pat medical history of permanent atrial fibrillation, HTN, HLD. She is followed by Dr. Swaziland, presents today as an add on appointment for BP control.   Patient previously wore a cardiac monitor in 05/2017 that showed atrial fibrillation with mean HR 99 BPM, suboptimally controlled. Diltiazem was added at that time. She has been on xarelto for anticoagulation. When last seen by Dr. Swaziland in 07/2023, her HR was a bit slow. Diltiazem was stopped. BP has been controlled with benazepril 20 mg daily  Today, patient presents with concerns about increasing blood pressure readings at home and an increase in palpitations. She reports that her blood pressure readings were initially well-controlled after an increase in her benazepril dosage from 5mg  to 20mg  in March. However, she has noticed a trend of increasing blood pressure readings over time. Review of BP log shows that her BP has been in the 130s-150s systolic with HR in the 70s-90s. She denies any associated symptoms such as headaches, chest pain, or trouble breathing. She also reports an increase in palpitations, which she attributes to her Afib. She denies any changes in lifestyle or diet that could contribute to the changes in her blood pressure. She remains active, participating in water aerobics regularly.   ROS: Reports increased episodes of palpitations. Denies chest pain, shortness of breath, ankle swelling, syncope, near syncope, dizziness, headaches.   Studies Reviewed: .       Risk Assessment/Calculations:    CHA2DS2-VASc Score = 4   This indicates a 4.8% annual risk of stroke. The patient's score is based upon: CHF History: 0 HTN History: 1 Diabetes History:  0 Stroke History: 0 Vascular Disease History: 0 Age Score: 2 Gender Score: 1    HYPERTENSION CONTROL Vitals:   04/09/24 1342 04/09/24 1423  BP: (!) 164/96 (!) 146/82    The patient's blood pressure is elevated above target today.  In order to address the patient's elevated BP: A new medication was prescribed today.          Physical Exam:   VS:  BP (!) 146/82   Pulse 80   Ht 5\' 6"  (1.676 m)   Wt 126 lb (57.2 kg)   LMP  (LMP Unknown)   SpO2 98%   BMI 20.34 kg/m    Wt Readings from Last 3 Encounters:  04/09/24 126 lb (57.2 kg)  07/29/23 137 lb 6.4 oz (62.3 kg)  07/18/22 138 lb 3.2 oz (62.7 kg)    GEN: Well nourished, well developed in no acute distress. Sitting comfortably on the exam table  NECK: No JVD  CARDIAC: Irregular rate and rhythm, no murmurs, rubs, gallops. Radial pulses 2+ bilaterally  RESPIRATORY:  Clear to auscultation without rales, wheezing or rhonchi. Normal WOB on room air  ABDOMEN: Soft, non-tender, non-distended EXTREMITIES:  No edema in BLE; No deformity   ASSESSMENT AND PLAN: .    Permament Atrial Fibrillation  Palpitations - Patient was previously on diltiazem. However, developed bradycardia and this was stopped  - Now, patient reports increased episodes of palpitations.  - Per review of BP log, her HR has been in the 70s-90s  - With elevated BP and reports of palpitations, start carvedilol 3.125 mg  BID  - Continue xarelto 20 mg daily   HTN  - Patient has been on benazepril 20 mg daily. Reports that that had been doing a good job of controlling her BP until a few weeks ago. She has noticed that her BP has been a bit elevated at home. Review of BP log shows that her systolic BP has been in the 130s-160s with diastolic BP in the 80s-90s - Denies headaches, chest pain. No dizziness, syncope, near syncope  - Start carvedilol 3.125 mg BID  - Continue benazepril 20 mg daily  - Instructed patient to keep a BP log. Goal SBP is 120s-140s, DBP 70s-80s   - Labs followed by PCP   HLD  - Lipid panel from 01/2024 showed LDL 95, HDL 65, triglycerides 66, total cholesterol 173  - Managed with diet, exercise. She enjoys water aerobics, encouraged her to continue to increase physical activity as tolerated    Dispo: Follow up with Dr. Swaziland in 05/2024 as scheduled   Signed, Jonita Albee, PA-C

## 2024-04-09 NOTE — Patient Instructions (Addendum)
 Medication Instructions:  START CARVEDILOL 3.125 MG TWICE DAILY *If you need a refill on your cardiac medications before your next appointment, please call your pharmacy*  Lab Work: NO LABS If you have labs (blood work) drawn today and your tests are completely normal, you will receive your results only by: MyChart Message (if you have MyChart) OR A paper copy in the mail If you have any lab test that is abnormal or we need to change your treatment, we will call you to review the results.  Testing/Procedures: NO TESTING  Follow-Up: At [facility], you and your health needs are our priority.  As part of our continuing mission to provide you with exceptional heart care, our providers are all part of one team.  This team includes your primary Cardiologist (physician) and Advanced Practice Providers or APPs (Physician Assistants and Nurse Practitioners) who all work together to provide you with the care you need, when you need it.  Your next appointment:   KEEP FOLLOW UP JUNE 2025   Provider:   Peter Swaziland, MD   We recommend signing up for the patient portal called "MyChart".  Sign up information is provided on this After Visit Summary.  MyChart is used to connect with patients for Virtual Visits (Telemedicine).  Patients are able to view lab/test results, encounter notes, upcoming appointments, etc.  Non-urgent messages can be sent to your provider as well.   To learn more about what you can do with MyChart, go to [email or web address].   Other Instructions MONITOR BLOOD PRESSURE AND RECORD READINGS ON BLOOD PRESSURE LOG GIVEN      1st Floor: - Lobby - Registration  - Pharmacy  - Lab - Cafe  2nd Floor: - PV Lab - Diagnostic Testing (echo, CT, nuclear med)  3rd Floor: - Vacant  4th Floor: - TCTS (cardiothoracic surgery) - AFib Clinic - Structural Heart Clinic - Vascular Surgery  - Vascular Ultrasound  5th Floor: - HeartCare Cardiology (general and EP) -  Clinical Pharmacy for coumadin, hypertension, lipid, weight-loss medications, and med management appointments    Valet parking services will be available as well.

## 2024-04-09 NOTE — Telephone Encounter (Signed)
 Patient called and to talk with Dr. Swaziland or nurse in regards to her medication for blood pressure

## 2024-04-09 NOTE — Telephone Encounter (Signed)
 Please disregard

## 2024-04-09 NOTE — Telephone Encounter (Signed)
 Patient identification verified by 2 forms. Marilynn Rail, RN    Called and spoke to patient  Patient states:   -on 3/24 Dr. Swaziland increase Benazepril to 20 mg   -initially BP was doing good   -recently BP has started to increase   -she is also having increase palpitations   -palpitation occur almost daily   -at 7am 4/10: 165/101 Hr: 77 (took BP medication 5:45am)   -4/9 at 9:20am 153/98  -4/9 at 7pm 156/98 HR: 70  -4/8: at 7am  160/108 HR: 75   -4/7 at 7am 144/94 Hr: 82  -4/7 at 6pm 138/106 Hr: 80 Patient denies:   -headaches   -chest pressure/pain   -SOB/difficulty breathing  Patient scheduled for OV 4/10 at 1:55pm with PA [facility]  Patient agrees, no further questions at this time

## 2024-04-14 ENCOUNTER — Telehealth: Payer: Self-pay | Admitting: Cardiology

## 2024-04-14 MED ORDER — CARVEDILOL 6.25 MG PO TABS: 6.2500 mg | ORAL_TABLET | Freq: Two times a day (BID) | ORAL | 3 refills | Status: AC

## 2024-04-14 NOTE — Telephone Encounter (Signed)
 Spoke to patient Dr.Jordan's advice given.She will increase Carvedilol to 6.25 mg twice a day.Appointment scheduled with Dr.Jordan 5/16 at 8:00 am.She will monitor B/P daily and bring readings to appointment.

## 2024-04-14 NOTE — Telephone Encounter (Signed)
 Pt c/o medication issue:  1. Name of Medication: carvedilol (COREG) 3.125 MG tablet   2. How are you currently taking this medication (dosage and times per day)? 3.125 mg, 2 times daily   3. Are you having a reaction (difficulty breathing--STAT)? No  4. What is your medication issue? BP reading are still elevated 4/13-morning 155/96 HR 78         Evening 156/106 HR  4/14-morning 145/96 HR 82         Evening 156/98 HR 84 4/15-morning 149/104 HR 73

## 2024-05-06 DIAGNOSIS — H35342 Macular cyst, hole, or pseudohole, left eye: Secondary | ICD-10-CM | POA: Diagnosis not present

## 2024-05-06 DIAGNOSIS — H401132 Primary open-angle glaucoma, bilateral, moderate stage: Secondary | ICD-10-CM | POA: Diagnosis not present

## 2024-05-06 DIAGNOSIS — H353131 Nonexudative age-related macular degeneration, bilateral, early dry stage: Secondary | ICD-10-CM | POA: Diagnosis not present

## 2024-05-12 NOTE — Progress Notes (Unsigned)
 Linda Snow Date of Birth: [birth year] Medical Record #[id number]  History of Present Illness: Linda Snow is seen for  followup Afib. She has a history of permanent atrial fibrillation, hypertension, and hypercholesterolemia. She also has a family history of early coronary disease. She has had an elevated CRP level in the past. She wore a Holter monitor in June 2018 which showed Afib with suboptimal HR control. Cardizem  CD 120 mg daily was added. We I saw her last her HR was slow so diltiazem  was stopped. She was seen in April with rising BP. Had been on ACEi with improvement but then BP crept up. Also noted more palpitations. Was started on low dose Coreg .   On  followup today she states she is doing very well from a cardiac standpoint. She denies any significant palpitations. BP has been well controlled. No chest pain, dyspnea. No edema. No dizziness. She did have an insurance test showing mildly reduced ABIs of 0.68 on the right and 0.73 on the left. She has no claudication symptoms. She continues to be active with walking  or water aerobics. She is really not aware of Afib.  Current Outpatient Medications on File Prior to Visit  Medication Sig Dispense Refill   benazepril  (LOTENSIN ) 20 MG tablet Take 1 tablet (20 mg total) by mouth daily. 90 tablet 1   carvedilol  (COREG ) 6.25 MG tablet Take 1 tablet (6.25 mg total) by mouth 2 (two) times daily. 180 tablet 3   Cholecalciferol (VITAMIN D ) 2000 UNITS CAPS Take by mouth daily.     dorzolamide-timolol (COSOPT) 2-0.5 % ophthalmic solution Place 1 drop into both eyes 2 (two) times daily.     latanoprost (XALATAN) 0.005 % ophthalmic solution Place 1 drop into both eyes at bedtime.     Multiple Vitamin (MULTIVITAMIN) capsule Take 1 capsule by mouth daily.     vitamin C (ASCORBIC ACID) 500 MG tablet Take 500 mg by mouth daily.     XARELTO  20 MG TABS tablet TAKE 1 TABLET BY MOUTH DAILY WITH SUPPER 90 tablet 3   No current facility-administered  medications on file prior to visit.    Allergies  Allergen Reactions   Metoprolol  Other (See Comments)    hairloss    Past Medical History:  Diagnosis Date   Elevated C-reactive protein (CRP)    Hyperlipidemia    Hypertension    PAF (paroxysmal atrial fibrillation) (HCC)    hx of,echo 06/09/2010 normal LV function and normal atrial size    No past surgical history on file.  Social History   Tobacco Use  Smoking Status Never  Smokeless Tobacco Never    Social History   Substance and Sexual Activity  Alcohol Use No   Alcohol/week: 0.0 standard drinks of alcohol    Family History  Problem Relation Age of Onset   CVA Mother    Heart attack Mother    Hypertension Mother    Heart attack Father    Heart attack Brother     Review of Systems: As noted in HPI. All other systems were reviewed and are negative.  Physical Exam: LMP  (LMP Unknown)  Repeat pulse was 75.  GENERAL:  Well appearing WF in NAD HEENT:  PERRL, EOMI, sclera are clear. Oropharynx is clear. NECK:  No jugular venous distention, carotid upstroke brisk and symmetric, no bruits, no thyromegaly or adenopathy LUNGS:  Clear to auscultation bilaterally CHEST:  Unremarkable HEART:  IRRR,  PMI not displaced or sustained,S1 and S2 within normal limits,  no S3, no S4: no clicks, no rubs, no murmurs ABD:  Soft, nontender. BS +, no masses or bruits. No hepatomegaly, no splenomegaly EXT:  2 + pulses throughout, no edema, no cyanosis no clubbing SKIN:  Warm and dry.  No rashes NEURO:  Alert and oriented x 3. Cranial nerves II through XII intact. PSYCH:  Cognitively intact   LABORATORY DATA: Labs reviewed from 09/04/16: cholesterol 208, triglycerides 79, HDL 71, LDL 121. CMET normal. Dated 10/03/17: cholesterol 185, triglycerides 69. HDL 70, LDL 101. Chemistries normal. Dated 11/13/18: cholesterol 199, triglycerides 62, HDL 75, LDL 111. CMET normal. Dated 12/22/19: cholesterol 196, triglycerides 67, HDL 71, LDL  112. CMET, TSH, Hgb normal Dated 12/13/20: cholesterol 188, triglycerides 47, HDL 66, LDL 112. CMET and CBC normal. Dated 12/18/21: cholesterol 190, triglycerides 54, HDL 74, LDL 106. CMET normal Dated 12/21/22: cholesterol 168, triglycerides 48, HDL 74, LDL 84. A1c 5.5%. CMET and CBC normal.       Assessment / Plan:  1. HTN-   2. Atrial fibrillation.  Permanent. She is  asymptomatic.  HR is slow on diltiazem . I have recommended she discontinue this now. Only on a low dose.   Her CHAD-Vasc score is 3. Continue Xarelto .   3. Hyperlipidemia.  LDL is mildly elevated at 84. It has dropped 20 points in the past year with dietary therapy. Continue dietary modification.  4. ? PAD. Mild reduction in ABIs on home test. No claudication symptoms. Normal pulses.   Follow up in one year

## 2024-05-15 ENCOUNTER — Encounter: Payer: Self-pay | Admitting: Cardiology

## 2024-05-15 ENCOUNTER — Ambulatory Visit: Attending: Cardiology | Admitting: Cardiology

## 2024-05-15 VITALS — BP 126/76 | HR 75 | Ht 66.0 in | Wt 131.2 lb

## 2024-05-15 DIAGNOSIS — I4821 Permanent atrial fibrillation: Secondary | ICD-10-CM | POA: Diagnosis not present

## 2024-05-15 DIAGNOSIS — I10 Essential (primary) hypertension: Secondary | ICD-10-CM

## 2024-05-15 NOTE — Patient Instructions (Signed)

## 2024-06-26 ENCOUNTER — Ambulatory Visit: Admitting: Cardiology

## 2024-07-02 DIAGNOSIS — Z01419 Encounter for gynecological examination (general) (routine) without abnormal findings: Secondary | ICD-10-CM | POA: Diagnosis not present

## 2024-07-02 DIAGNOSIS — Z1231 Encounter for screening mammogram for malignant neoplasm of breast: Secondary | ICD-10-CM | POA: Diagnosis not present

## 2024-07-02 DIAGNOSIS — M8580 Other specified disorders of bone density and structure, unspecified site: Secondary | ICD-10-CM | POA: Diagnosis not present

## 2024-07-02 DIAGNOSIS — I4891 Unspecified atrial fibrillation: Secondary | ICD-10-CM | POA: Diagnosis not present

## 2024-07-02 DIAGNOSIS — Z7901 Long term (current) use of anticoagulants: Secondary | ICD-10-CM | POA: Diagnosis not present

## 2024-07-02 DIAGNOSIS — I10 Essential (primary) hypertension: Secondary | ICD-10-CM | POA: Diagnosis not present

## 2024-07-14 DIAGNOSIS — R922 Inconclusive mammogram: Secondary | ICD-10-CM | POA: Diagnosis not present

## 2024-09-04 ENCOUNTER — Other Ambulatory Visit: Payer: Self-pay | Admitting: Cardiology

## 2024-09-09 DIAGNOSIS — H401131 Primary open-angle glaucoma, bilateral, mild stage: Secondary | ICD-10-CM | POA: Diagnosis not present

## 2024-09-09 DIAGNOSIS — Z961 Presence of intraocular lens: Secondary | ICD-10-CM | POA: Diagnosis not present

## 2024-10-21 NOTE — Progress Notes (Signed)
 Linda Snow Pitch Date of Birth: 03/21/1945 Medical Record #[id number]  History of Present Illness: Linda Snow is seen for  followup Afib. She has a history of permanent atrial fibrillation, hypertension, and hypercholesterolemia. She also has a family history of early coronary disease. She has had an elevated CRP level in the past. She wore a Holter monitor in June 2018 which showed Afib with suboptimal HR control. Cardizem  CD 120 mg daily was added. We I saw her last her HR was slow so diltiazem  was stopped. She was seen in April with rising BP. Had been on ACEi with improvement but then BP crept up. Also noted more palpitations. Was started on low dose Coreg .   On  followup today she states she is doing very well from a cardiac standpoint. Her blood pressure has been running higher. Readings from 122-160/80-100 She is doing water aerobics 4 days a week.  No chest pain, dyspnea. No edema. No dizziness. HR has been in a normal range.   Current Outpatient Medications on File Prior to Visit  Medication Sig Dispense Refill   carvedilol  (COREG ) 6.25 MG tablet Take 1 tablet (6.25 mg total) by mouth 2 (two) times daily. 180 tablet 3   Cholecalciferol (VITAMIN D ) 2000 UNITS CAPS Take by mouth daily.     dorzolamide-timolol (COSOPT) 2-0.5 % ophthalmic solution Place 1 drop into both eyes 2 (two) times daily.     latanoprost (XALATAN) 0.005 % ophthalmic solution Place 1 drop into both eyes at bedtime.     Multiple Vitamin (MULTIVITAMIN) capsule Take 1 capsule by mouth daily.     Multiple Vitamins-Minerals (PRESERVISION AREDS 2) CAPS Take 1 capsule by mouth daily.     vitamin C (ASCORBIC ACID) 500 MG tablet Take 500 mg by mouth daily.     XARELTO  20 MG TABS tablet TAKE 1 TABLET BY MOUTH DAILY WITH SUPPER 90 tablet 3   No current facility-administered medications on file prior to visit.    Allergies  Allergen Reactions   Metoprolol  Other (See Comments)    hairloss    Past Medical History:   Diagnosis Date   Elevated C-reactive protein (CRP)    Hyperlipidemia    Hypertension    PAF (paroxysmal atrial fibrillation) (HCC)    hx of,echo 06/09/2010 normal LV function and normal atrial size    No past surgical history on file.  Social History   Tobacco Use  Smoking Status Never  Smokeless Tobacco Never    Social History   Substance and Sexual Activity  Alcohol Use No   Alcohol/week: 0.0 standard drinks of alcohol    Family History  Problem Relation Age of Onset   CVA Mother    Heart attack Mother    Hypertension Mother    Heart attack Father    Heart attack Brother     Review of Systems: As noted in HPI. All other systems were reviewed and are negative.  Physical Exam: BP (!) 156/100   Pulse 81   Ht 5' 6 (1.676 m)   Wt 132 lb (59.9 kg)   LMP  (LMP Unknown)   SpO2 94%   BMI 21.31 kg/m   GENERAL:  Well appearing WF in NAD HEENT:  PERRL, EOMI, sclera are clear. Oropharynx is clear. NECK:  No jugular venous distention, carotid upstroke brisk and symmetric, no bruits, no thyromegaly or adenopathy LUNGS:  Clear to auscultation bilaterally CHEST:  Unremarkable HEART:  IRRR,  PMI not displaced or sustained,S1 and S2 within normal limits,  no S3, no S4: no clicks, no rubs, no murmurs ABD:  Soft, nontender. BS +, no masses or bruits. No hepatomegaly, no splenomegaly EXT:  2 + pulses throughout, no edema, no cyanosis no clubbing SKIN:  Warm and dry.  No rashes NEURO:  Alert and oriented x 3. Cranial nerves II through XII intact. PSYCH:  Cognitively intact   LABORATORY DATA: Labs reviewed from 09/04/16: cholesterol 208, triglycerides 79, HDL 71, LDL 121. CMET normal. Dated 10/03/17: cholesterol 185, triglycerides 69. HDL 70, LDL 101. Chemistries normal. Dated 11/13/18: cholesterol 199, triglycerides 62, HDL 75, LDL 111. CMET normal. Dated 12/22/19: cholesterol 196, triglycerides 67, HDL 71, LDL 112. CMET, TSH, Hgb normal Dated 12/13/20: cholesterol 188,  triglycerides 47, HDL 66, LDL 112. CMET and CBC normal. Dated 12/18/21: cholesterol 190, triglycerides 54, HDL 74, LDL 106. CMET normal Dated 12/21/22: cholesterol 168, triglycerides 48, HDL 74, LDL 84. A1c 5.5%. CMET and CBC normal. Dated 01/17/24: cholesterol 173, triglycerides 66, HDL 65, LDL 95. A1c 5.7% CMET normal.          Assessment / Plan:  1. HTN- BP is higher. Will increase benazepril  to 40 mg daily. Continue current Coreg  dose.   2. Atrial fibrillation.  Permanent. She is  asymptomatic.  HR is normal on low dose Coreg .   Her CHAD-Vasc score is 3. Continue Xarelto . She did have bradycardia on diltiazem  but tolerating Coreg  well.   3. Hyperlipidemia.  LDL is mildly elevated at 95. Continue dietary modification.  4. ? PAD. Mild reduction in ABIs on home test. No claudication symptoms. Normal pulses.   Follow up in 6 months.

## 2024-11-04 ENCOUNTER — Encounter: Payer: Self-pay | Admitting: Cardiology

## 2024-11-04 ENCOUNTER — Ambulatory Visit: Attending: Cardiology | Admitting: Cardiology

## 2024-11-04 VITALS — BP 156/100 | HR 81 | Ht 66.0 in | Wt 132.0 lb

## 2024-11-04 DIAGNOSIS — I10 Essential (primary) hypertension: Secondary | ICD-10-CM | POA: Diagnosis not present

## 2024-11-04 DIAGNOSIS — I4821 Permanent atrial fibrillation: Secondary | ICD-10-CM

## 2024-11-04 DIAGNOSIS — E7800 Pure hypercholesterolemia, unspecified: Secondary | ICD-10-CM | POA: Diagnosis not present

## 2024-11-04 MED ORDER — BENAZEPRIL HCL 40 MG PO TABS: 40.0000 mg | ORAL_TABLET | Freq: Every day | ORAL | 3 refills | Status: AC

## 2024-11-04 NOTE — Patient Instructions (Signed)
 Medication Instructions:  Increase Benazepril  to 40 mg daily Continue all other medications *If you need a refill on your cardiac medications before your next appointment, please call your pharmacy*  Lab Work: None odered  Testing/Procedures: None ordered  Follow-Up: At [facility], you and your health needs are our priority.  As part of our continuing mission to provide you with exceptional heart care, our providers are all part of one team.  This team includes your primary Cardiologist (physician) and Advanced Practice Providers or APPs (Physician Assistants and Nurse Practitioners) who all work together to provide you with the care you need, when you need it.  Your next appointment:  6 months   Call in Feb to schedule May appointment     Provider:  Dr.Jordan   We recommend signing up for the patient portal called MyChart.  Sign up information is provided on this After Visit Summary.  MyChart is used to connect with patients for Virtual Visits (Telemedicine).  Patients are able to view lab/test results, encounter notes, upcoming appointments, etc.  Non-urgent messages can be sent to your provider as well.   To learn more about what you can do with MyChart, go to [email or web address].

## 2024-11-18 DIAGNOSIS — L821 Other seborrheic keratosis: Secondary | ICD-10-CM | POA: Diagnosis not present

## 2024-11-18 DIAGNOSIS — L218 Other seborrheic dermatitis: Secondary | ICD-10-CM | POA: Diagnosis not present

## 2024-11-18 DIAGNOSIS — Z85828 Personal history of other malignant neoplasm of skin: Secondary | ICD-10-CM | POA: Diagnosis not present

## 2024-11-18 DIAGNOSIS — L812 Freckles: Secondary | ICD-10-CM | POA: Diagnosis not present

## 2024-12-04 ENCOUNTER — Other Ambulatory Visit: Payer: Self-pay | Admitting: Cardiology

## 2024-12-04 DIAGNOSIS — I480 Paroxysmal atrial fibrillation: Secondary | ICD-10-CM

## 2024-12-04 NOTE — Telephone Encounter (Signed)
 Xarelto  20mg  refill request received. Pt is 79 years old, weight-59.9kg, Crea-0.59 on 01/17/24 via scanned labs from PCP, last seen by Dr. Jordan on 11/04/24, Diagnosis-Afib, CrCl-73.73ml/min; Dose is appropriate based on dosing criteria. Will send in refill to requested pharmacy.

## 2024-12-18 ENCOUNTER — Telehealth (HOSPITAL_BASED_OUTPATIENT_CLINIC_OR_DEPARTMENT_OTHER): Payer: Self-pay | Admitting: *Deleted

## 2024-12-18 DIAGNOSIS — Z8679 Personal history of other diseases of the circulatory system: Secondary | ICD-10-CM | POA: Diagnosis not present

## 2024-12-18 DIAGNOSIS — R54 Age-related physical debility: Secondary | ICD-10-CM | POA: Diagnosis not present

## 2024-12-18 DIAGNOSIS — Z86018 Personal history of other benign neoplasm: Secondary | ICD-10-CM | POA: Diagnosis not present

## 2024-12-18 NOTE — Telephone Encounter (Signed)
"  ° °  Pre-operative Risk Assessment    Patient Name: Linda Snow  DOB: Aug 03, 1945 MRN: [id number]   Date of last office visit: 11/04/24 DR. JORDAN Date of next office visit: NONE   Request for Surgical Clearance    Procedure:  COLONOSCOPY  Date of Surgery:  Clearance 02/03/25                                Surgeon:  DR. ELICIA Surgeon's Group or Practice Name:  [facility]  Phone number:  [phone] Fax number:  [phone]   Type of Clearance Requested:   - Medical  - Pharmacy:  Hold Rivaroxaban  (Xarelto ) x 2 DAYS PRIOR    Type of Anesthesia:  PROPOFOL    Additional requests/questions:    Bonney Niels Jest   12/18/2024, 12:03 PM   "

## 2024-12-22 NOTE — Telephone Encounter (Signed)
 Dr. Jordan, you recently saw this pt in clinic. Are you able to comment on surgical clearance for upcoming colonoscopy scheduled for 02/03/2025? Please route your response to P CV DIV PREOP. Thank you!

## 2024-12-22 NOTE — Telephone Encounter (Signed)
 Patient with diagnosis of a fib on xarelto  for anticoagulation.    Procedure: colonoscopy Date of procedure: 02/03/25   CHA2DS2-VASc Score = 4  This indicates a 4.8% annual risk of stroke. The patient's score is based upon: CHF History: 0 HTN History: 1 Diabetes History: 0 Stroke History: 0 Vascular Disease History: 0 Age Score: 2 Gender Score: 1    CrCl 72 ml/min Platelet count not available  Patient has not had an Afib/aflutter ablation in the last 3 months, DCCV within the last 4 weeks or a watchman implanted in the last 45 days   Per office protocol, patient can hold Xarelto  for 2 days prior to procedure.     **This guidance is not considered finalized until pre-operative APP has relayed final recommendations.**

## 2024-12-23 NOTE — Telephone Encounter (Signed)
" °  ° °  Primary Cardiologist: Peter Jordan, MD  Chart reviewed as part of pre-operative protocol coverage. Given past medical history and time since last visit, based on ACC/AHA guidelines, Linda Snow would be at acceptable risk for the planned procedure without further cardiovascular testing.   Procedure: colonoscopy Date of procedure: 02/03/25     CHA2DS2-VASc Score = 4  This indicates a 4.8% annual risk of stroke. The patient's score is based upon: CHF History: 0 HTN History: 1 Diabetes History: 0 Stroke History: 0 Vascular Disease History: 0 Age Score: 2 Gender Score: 1     CrCl 72 ml/min Platelet count not available   Patient has not had an Afib/aflutter ablation in the last 3 months, DCCV within the last 4 weeks or a watchman implanted in the last 45 days    Per office protocol, patient can hold Xarelto  for 2 days prior to procedure.  I will route this recommendation to the requesting party via Epic fax function and remove from pre-op pool.  Please call with questions.  Linda Snow. Linda Cuttino NP-C     12/23/2024, 8:45 AM [facility] [address] [place], [place] [address] Office [phone]      "

## 2025-05-03 ENCOUNTER — Ambulatory Visit: Admitting: Cardiology
